# Patient Record
Sex: Female | Born: 1935 | Race: White | Hispanic: No | Smoking: Never smoker
Health system: Southern US, Community
[De-identification: ages and names within clinical notes are randomized; demographics above are authoritative.]

## PROBLEM LIST (undated history)

## (undated) DIAGNOSIS — L570 Actinic keratosis: Secondary | ICD-10-CM

## (undated) HISTORY — DX: Actinic keratosis: L57.0

---

## 2005-03-29 ENCOUNTER — Ambulatory Visit: Payer: Self-pay | Admitting: Family Medicine

## 2006-05-27 ENCOUNTER — Ambulatory Visit: Payer: Self-pay | Admitting: Family Medicine

## 2007-09-22 ENCOUNTER — Ambulatory Visit: Payer: Self-pay | Admitting: Family Medicine

## 2007-09-24 ENCOUNTER — Ambulatory Visit: Payer: Self-pay | Admitting: Family Medicine

## 2008-07-11 ENCOUNTER — Ambulatory Visit: Payer: Self-pay | Admitting: Family Medicine

## 2008-09-26 ENCOUNTER — Ambulatory Visit: Payer: Self-pay | Admitting: Family Medicine

## 2009-10-17 ENCOUNTER — Ambulatory Visit: Payer: Self-pay | Admitting: Family Medicine

## 2010-10-30 ENCOUNTER — Ambulatory Visit: Payer: Self-pay | Admitting: Family Medicine

## 2012-04-15 ENCOUNTER — Ambulatory Visit: Payer: Self-pay | Admitting: Family Medicine

## 2013-04-30 ENCOUNTER — Ambulatory Visit: Payer: Self-pay | Admitting: Family Medicine

## 2014-05-10 ENCOUNTER — Ambulatory Visit: Payer: Self-pay | Admitting: Family Medicine

## 2016-01-30 ENCOUNTER — Other Ambulatory Visit: Payer: Self-pay | Admitting: Family Medicine

## 2016-01-30 DIAGNOSIS — Z1231 Encounter for screening mammogram for malignant neoplasm of breast: Secondary | ICD-10-CM

## 2016-02-14 ENCOUNTER — Other Ambulatory Visit: Payer: Self-pay | Admitting: Family Medicine

## 2016-02-14 ENCOUNTER — Ambulatory Visit
Admission: RE | Admit: 2016-02-14 | Discharge: 2016-02-14 | Disposition: A | Discharge status: Home / Self Care | Payer: Medicare Other | Source: Ambulatory Visit | Attending: Family Medicine | Admitting: Family Medicine

## 2016-02-14 DIAGNOSIS — Z1231 Encounter for screening mammogram for malignant neoplasm of breast: Secondary | ICD-10-CM

## 2017-03-21 ENCOUNTER — Other Ambulatory Visit: Payer: Self-pay | Admitting: Family Medicine

## 2017-04-17 ENCOUNTER — Other Ambulatory Visit: Payer: Self-pay | Admitting: Family Medicine

## 2017-04-17 DIAGNOSIS — Z1231 Encounter for screening mammogram for malignant neoplasm of breast: Secondary | ICD-10-CM

## 2017-04-30 ENCOUNTER — Ambulatory Visit
Admission: RE | Admit: 2017-04-30 | Discharge: 2017-04-30 | Disposition: A | Discharge status: Home / Self Care | Payer: Medicare Other | Source: Ambulatory Visit | Attending: Family Medicine | Admitting: Family Medicine

## 2017-04-30 DIAGNOSIS — Z1231 Encounter for screening mammogram for malignant neoplasm of breast: Secondary | ICD-10-CM | POA: Insufficient documentation

## 2018-06-09 ENCOUNTER — Other Ambulatory Visit: Payer: Self-pay | Admitting: Family Medicine

## 2018-06-09 DIAGNOSIS — Z1231 Encounter for screening mammogram for malignant neoplasm of breast: Secondary | ICD-10-CM

## 2018-07-02 ENCOUNTER — Ambulatory Visit
Admission: RE | Admit: 2018-07-02 | Discharge: 2018-07-02 | Disposition: A | Discharge status: Home / Self Care | Payer: Medicare Other | Source: Ambulatory Visit | Attending: Family Medicine | Admitting: Family Medicine

## 2018-07-02 DIAGNOSIS — Z1231 Encounter for screening mammogram for malignant neoplasm of breast: Secondary | ICD-10-CM | POA: Insufficient documentation

## 2019-05-17 DIAGNOSIS — C4491 Basal cell carcinoma of skin, unspecified: Secondary | ICD-10-CM

## 2019-05-17 HISTORY — DX: Basal cell carcinoma of skin, unspecified: C44.91

## 2019-05-31 DIAGNOSIS — C439 Malignant melanoma of skin, unspecified: Secondary | ICD-10-CM

## 2019-05-31 HISTORY — DX: Malignant melanoma of skin, unspecified: C43.9

## 2019-08-09 ENCOUNTER — Other Ambulatory Visit: Payer: Self-pay | Admitting: Family Medicine

## 2019-08-09 DIAGNOSIS — Z1231 Encounter for screening mammogram for malignant neoplasm of breast: Secondary | ICD-10-CM

## 2019-09-07 ENCOUNTER — Ambulatory Visit
Admission: RE | Admit: 2019-09-07 | Discharge: 2019-09-07 | Disposition: A | Discharge status: Home / Self Care | Payer: Medicare Other | Source: Ambulatory Visit | Attending: Family Medicine | Admitting: Family Medicine

## 2019-09-07 DIAGNOSIS — Z1231 Encounter for screening mammogram for malignant neoplasm of breast: Secondary | ICD-10-CM | POA: Diagnosis present

## 2020-02-21 ENCOUNTER — Other Ambulatory Visit: Payer: Self-pay

## 2020-02-21 ENCOUNTER — Encounter: Payer: Self-pay | Admitting: Dermatology

## 2020-02-21 ENCOUNTER — Ambulatory Visit: Payer: Medicare Other | Admitting: Dermatology

## 2020-02-21 DIAGNOSIS — L821 Other seborrheic keratosis: Secondary | ICD-10-CM

## 2020-02-21 DIAGNOSIS — Z85820 Personal history of malignant melanoma of skin: Secondary | ICD-10-CM | POA: Diagnosis not present

## 2020-02-21 DIAGNOSIS — L720 Epidermal cyst: Secondary | ICD-10-CM

## 2020-02-21 DIAGNOSIS — L578 Other skin changes due to chronic exposure to nonionizing radiation: Secondary | ICD-10-CM

## 2020-02-21 DIAGNOSIS — L814 Other melanin hyperpigmentation: Secondary | ICD-10-CM

## 2020-02-21 DIAGNOSIS — D692 Other nonthrombocytopenic purpura: Secondary | ICD-10-CM

## 2020-02-21 DIAGNOSIS — Z1283 Encounter for screening for malignant neoplasm of skin: Secondary | ICD-10-CM | POA: Diagnosis not present

## 2020-02-21 DIAGNOSIS — D1800 Hemangioma unspecified site: Secondary | ICD-10-CM

## 2020-02-21 DIAGNOSIS — L565 Disseminated superficial actinic porokeratosis (DSAP): Secondary | ICD-10-CM

## 2020-02-21 DIAGNOSIS — Z85828 Personal history of other malignant neoplasm of skin: Secondary | ICD-10-CM

## 2020-02-21 DIAGNOSIS — L570 Actinic keratosis: Secondary | ICD-10-CM

## 2020-02-21 NOTE — Patient Instructions (Signed)
Cryotherapy Aftercare  . Wash gently with soap and water everyday.   . Apply Vaseline and Band-Aid daily until healed.  

## 2020-02-21 NOTE — Progress Notes (Signed)
Follow-Up Visit   Subjective  Angela Trevino is a 86 y.o. female who presents for the following: Annual Exam (Total body skin exam, hx Melanoma L lat knee 05/31/19, hx BCC R post neck).  Has scaly spots on arms and legs she would like checked. H/o bx-proven AK R elbow. 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  Excised 05/31/2019   The following portions of the chart were reviewed this encounter and updated as appropriate:      Review of Systems:  No other skin or systemic complaints except as noted in HPI or Assessment and Plan.  Objective  Well appearing patient in no apparent distress; mood and affect are within normal limits.  A full examination was performed including scalp, head, eyes, ears, nose, lips, neck, chest, axillae, abdomen, back, buttocks, bilateral upper extremities, bilateral lower extremities, hands, feet, fingers, toes, fingernails, and toenails. All findings within normal limits unless otherwise noted below.  Objective  Left lateral Knee: Well healed scar with no evidence of recurrence, no lymphadenopathy.   Objective  R posterior neck: Well healed scar with no evidence of recurrence.   Objective  bil arms, legs: Pink scaly macules  Objective  L forearm x 6, R forearm x 1, R upper arm x 1 (8): Pink scaly macules   Objective  forehead: Firm white papules   Assessment & Plan    Actinic Damage - diffuse scaly erythematous macules with underlying dyspigmentation - Recommend daily broad spectrum sunscreen SPF 30+ to sun-exposed areas, reapply every 2 hours as needed.  - Call for new or changing lesions.  Purpura - Violaceous macules and patches - Benign - Related to age, sun damage and/or use of blood thinners - Observe - Can use OTC arnica containing moisturizer such as Dermend Bruise Formula if desired - Call for worsening or other concerns  Seborrheic Keratoses - Stuck-on, waxy,  tan-brown papules and plaques  - Discussed benign etiology and prognosis. - Observe - Call for any changes - R cheek, trunk, arms, legs Hemangiomas - Red papules - Discussed benign nature - Observe - Call for any changes  Lentigines - Scattered tan macules - Discussed due to sun exposure - Benign, observe - Call for any changes  Skin cancer screening performed today.   History of melanoma Left lateral Knee  Clear. Observe for recurrence. Call clinic for new or changing lesions.  Recommend regular skin exams, daily broad-spectrum spf 30+ sunscreen use, and photoprotection.     History of basal cell carcinoma (BCC) R posterior neck  Clear, observe for changes   DSAP (disseminated superficial actinic porokeratosis) bil arms, legs  Observe, recommend sunscreen, photoprotection   AK (actinic keratosis) (8) L forearm x 6, R forearm x 1, R upper arm x 1  Destruction of lesion - L forearm x 6, R forearm x 1, R upper arm x 1  Destruction method: cryotherapy   Informed consent: discussed and consent obtained   Lesion destroyed using liquid nitrogen: Yes   Region frozen until ice ball extended beyond lesion: Yes   Outcome: patient tolerated procedure well with no complications   Post-procedure details: wound care instructions given    Epidermal cyst forehead  Benign, observe.    Return in about 6 months (around 08/23/2020) for TBSE, hx Melanoma, BCC, AKs.   I, Sonya Hupman, RMA, am acting as scribe for [name], MD . Documentation: I have reviewed the above documentation for accuracy and completeness, and I agree  with the above.  [name] MD

## 2020-08-22 ENCOUNTER — Ambulatory Visit: Payer: Medicare Other | Admitting: Dermatology

## 2020-08-22 ENCOUNTER — Other Ambulatory Visit: Payer: Self-pay

## 2020-08-22 DIAGNOSIS — Z1283 Encounter for screening for malignant neoplasm of skin: Secondary | ICD-10-CM | POA: Diagnosis not present

## 2020-08-22 DIAGNOSIS — L720 Epidermal cyst: Secondary | ICD-10-CM | POA: Diagnosis not present

## 2020-08-22 DIAGNOSIS — D692 Other nonthrombocytopenic purpura: Secondary | ICD-10-CM

## 2020-08-22 DIAGNOSIS — L565 Disseminated superficial actinic porokeratosis (DSAP): Secondary | ICD-10-CM

## 2020-08-22 DIAGNOSIS — L578 Other skin changes due to chronic exposure to nonionizing radiation: Secondary | ICD-10-CM

## 2020-08-22 DIAGNOSIS — Z85828 Personal history of other malignant neoplasm of skin: Secondary | ICD-10-CM | POA: Diagnosis not present

## 2020-08-22 DIAGNOSIS — L853 Xerosis cutis: Secondary | ICD-10-CM

## 2020-08-22 DIAGNOSIS — L570 Actinic keratosis: Secondary | ICD-10-CM | POA: Diagnosis not present

## 2020-08-22 DIAGNOSIS — L814 Other melanin hyperpigmentation: Secondary | ICD-10-CM

## 2020-08-22 DIAGNOSIS — D1800 Hemangioma unspecified site: Secondary | ICD-10-CM

## 2020-08-22 DIAGNOSIS — Z85820 Personal history of malignant melanoma of skin: Secondary | ICD-10-CM

## 2020-08-22 DIAGNOSIS — D2261 Melanocytic nevi of right upper limb, including shoulder: Secondary | ICD-10-CM

## 2020-08-22 DIAGNOSIS — L821 Other seborrheic keratosis: Secondary | ICD-10-CM

## 2020-08-22 MED ORDER — TRIAMCINOLONE ACETONIDE 0.1 % EX CREA: 1.0000 "application " | TOPICAL_CREAM | Freq: Two times a day (BID) | CUTANEOUS | 1 refills | Status: AC | PRN

## 2020-08-22 NOTE — Patient Instructions (Addendum)
Dry Skin Care  What causes dry skin?  Dry skin is common and results from inadequate moisture in the outer skin layers. Dry skin usually results from the excessive loss of moisture from the skin surface. This occurs due to two major factors: 1. Normally the skin's oil glands deposit a layer of oil on the skin's surface. This layer of oil prevents the loss of moisture from the skin. Exposure to soaps, cleaners, solvents, and disinfectants removes this oily film, allowing water to escape. 2. Water loss from the skin increases when the humidity is low. During winter months we spend a lot of time indoors where the air is heated. Heated air has very low humidity. This also contributes to dry skin.  A tendency for dry skin may accompany such disorders as eczema. Also, as people age, the number of functioning oil glands decreases, and the tendency toward dry skin can be a sensation of skin tightness when emerging from the shower.  How do I manage dry skin?  1. Humidify your environment. This can be accomplished by using a humidifier in your bedroom at night during winter months. 2. Bathing can actually put moisture back into your skin if done right. Take the following steps while bathing to sooth dry skin:  Avoid hot water, which only dries the skin and makes itching worse. Use warm water.  Avoid washcloths or extensive rubbing or scrubbing.  Use mild soaps like unscented Dove, Oil of Olay, Cetaphil, Basis, or CeraVe.  If you take baths rather than showers, rinse off soap residue with clean water before getting out of tub.  Once out of the shower/tub, pat dry gently with a soft towel. Leave your skin damp.  While still damp, apply any medicated ointment/cream you were prescribed to the affected areas. After you apply your medicated ointment/cream, then apply your moisturizer to your whole body.This is the most important step in dry skin care. If this is omitted, your skin will continue to be  dry.  The choice of moisturizer is also very important. In general, lotion will not provider enough moisture to severely dry skin because it is water based. You should use an ointment or cream. Moisturizers should also be unscented. Good choices include Vaseline (plain petrolatum), Aquaphor, Cetaphil, CeraVe, Vanicream, DML Forte, Aveeno moisture, or Eucerin Cream.  Bath oils can be helpful, but do not replace the application of moisturizer after the bath. In addition, they make the tub slippery causing an increased risk for falls. Therefore, we do not recommend their use.    Melanoma ABCDEs  Melanoma is the most dangerous type of skin cancer, and is the leading cause of death from skin disease.  You are more likely to develop melanoma if you:  Have light-colored skin, light-colored eyes, or red or blond hair  Spend a lot of time in the sun  Tan regularly, either outdoors or in a tanning bed  Have had blistering sunburns, especially during childhood  Have a close family member who has had a melanoma  Have atypical moles or large birthmarks  Early detection of melanoma is key since treatment is typically straightforward and cure rates are extremely high if we catch it early.   The first sign of melanoma is often a change in a mole or a new dark spot.  The ABCDE system is a way of remembering the signs of melanoma.  A for asymmetry:  The two halves do not match. B for border:  The edges of the growth  are irregular. C for color:  A mixture of colors are present instead of an even brown color. D for diameter:  Melanomas are usually (but not always) greater than 12mm - the size of a pencil eraser. E for evolution:  The spot keeps changing in size, shape, and color.  Please check your skin once per month between visits. You can use a small mirror in front and a large mirror behind you to keep an eye on the back side or your body.   If you see any new or changing lesions before your next  follow-up, please call to schedule a visit.  Please continue daily skin protection including broad spectrum sunscreen SPF 30+ to sun-exposed areas, reapplying every 2 hours as needed when you're outdoors.   Cryotherapy Aftercare  . Wash gently with soap and water everyday.   Marland Kitchen Apply Vaseline and Band-Aid daily until healed.

## 2020-08-22 NOTE — Progress Notes (Signed)
Follow-Up Visit   Subjective  Angela Trevino is a 87 y.o. female who presents for the following: 6 month tbse (Patient here today for 6 month tbse. She has history of bcc on right posterior neck. Patient states she has been very itchy on back and shoulders. States it has been like this for a month. Patient denies using any new soaps, creams, medications or new laundry detergent. Patient also states she has new spot she noticed a month ago a spot on right thigh. ).  It is sore.    The following portions of the chart were reviewed this encounter and updated as appropriate:        Objective  Well appearing patient in no apparent distress; mood and affect are within normal limits.  A full examination was performed including scalp, head, eyes, ears, nose, lips, neck, chest, axillae, abdomen, back, buttocks, bilateral upper extremities, bilateral lower extremities, hands, feet, fingers, toes, fingernails, and toenails. All findings within normal limits unless otherwise noted below.  Objective  bilateral legs, bilateral arms: Pink brown scaly macules some with keratotic rim  Objective  Right anterior thigh x 1, left upper arm x 4, right upper arm x 2, right nasal tip x 1 (8): Erythematous thin papules/macules with gritty scale.   Objective  bilateral shoulder, back, bilateral legs: Dry flaky skin  Assessment & Plan  Epidermoid cyst of skin forehead  Benign-appearing. Exam most consistent with an epidermal inclusion cyst. Discussed that a cyst is a benign growth that can grow over time and sometimes get irritated or inflamed. Recommend observation if it is not bothersome. Discussed option of surgical excision to remove it if it is growing, symptomatic, or other changes noted. Please call for new or changing lesions so they can be evaluated.     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  Observation.   Call clinic for new or changing lesions.  Recommend daily use of broad spectrum spf 30+ sunscreen to sun-exposed areas. Photoprotection   Actinic keratosis (8) Right anterior thigh x 1, left upper arm x 4, right upper arm x 2, right nasal tip x 1  Hypertrophic AK R ant thigh Prior to procedure, discussed risks of blister formation, small wound, skin dyspigmentation, or rare scar following cryotherapy.    Destruction of lesion - Right anterior thigh x 1, left upper arm x 4, right upper arm x 2, right nasal tip x 1  Destruction method: cryotherapy   Informed consent: discussed and consent obtained   Lesion destroyed using liquid nitrogen: Yes   Region frozen until ice ball extended beyond lesion: Yes   Outcome: patient tolerated procedure well with no complications   Post-procedure details: wound care instructions given    Xerosis cutis bilateral shoulder, back, bilateral legs  With pruritus, due to chronically dry skin  Tmc 0.1 % cream use once or twice daily as needed for itchy areas of shoulders and back   Recommend mild soap and moisturizing cream 1-2 times daily.      triamcinolone (KENALOG) 0.1 % - bilateral shoulder, back, bilateral legs  Lentigines - Scattered tan macules - Discussed due to sun exposure - Benign, observe - Call for any changes  Seborrheic Keratoses Back, right side cheek, bilateral legs - Stuck-on, waxy, tan-brown papules and plaques  - Discussed benign etiology and prognosis. - Observe - Call for any changes  Melanocytic Nevi Right arms  - Tan-brown and/or pink-flesh-colored symmetric macules and papules -  Benign appearing on exam today - Observation - Call clinic for new or changing moles - Recommend daily use of broad spectrum spf 30+ sunscreen to sun-exposed areas.   Hemangiomas back - Red papules - Discussed benign nature - Observe - Call for any changes  Actinic Damage - Chronic, secondary to cumulative UV/sun exposure -  diffuse scaly erythematous macules with underlying dyspigmentation - Recommend daily broad spectrum sunscreen SPF 30+ to sun-exposed areas, reapply every 2 hours as needed.  - Call for new or changing lesions.  Purpura - Chronic; persistent and recurrent.  Treatable, but not curable. Bilateral arms  - Violaceous macules and patches - Benign - Related to trauma, age, sun damage and/or use of blood thinners, chronic use of topical and/or oral steroids - Observe - Can use OTC arnica containing moisturizer such as Dermend Bruise Formula if desired - Call for worsening or other concerns   History of Basal Cell Carcinoma of the Skin Right posterior neck - No evidence of recurrence today - Recommend regular full body skin exams - Recommend daily broad spectrum sunscreen SPF 30+ to sun-exposed areas, reapply every 2 hours as needed.  - Call if any new or changing lesions are noted between office visits  History of Melanoma Left lateral knee - No evidence of recurrence today - No lymphadenopathy - Recommend regular full body skin exams - Recommend daily broad spectrum sunscreen SPF 30+ to sun-exposed areas, reapply every 2 hours as needed.  - Call if any new or changing lesions are noted between office visits  Actinic Damage - chronic, secondary to cumulative UV radiation exposure/sun exposure over time - diffuse scaly erythematous macules with underlying dyspigmentation - Recommend daily broad spectrum sunscreen SPF 30+ to sun-exposed areas, reapply every 2 hours as needed.  - Call for new or changing lesions.   Skin cancer screening performed today.  Return in about 6 months (around 02/19/2021) for tbse, ak .  I, Ruthell Rummage, CMA, am acting as scribe for [name], MD.  Documentation: I have reviewed the above documentation for accuracy and completeness, and I agree with the above.  [name] MD

## 2021-01-30 ENCOUNTER — Ambulatory Visit: Payer: Medicare Other | Admitting: Dermatology

## 2021-02-20 ENCOUNTER — Other Ambulatory Visit: Payer: Self-pay | Admitting: Family Medicine

## 2021-02-20 DIAGNOSIS — Z1231 Encounter for screening mammogram for malignant neoplasm of breast: Secondary | ICD-10-CM

## 2021-03-09 ENCOUNTER — Other Ambulatory Visit: Payer: Self-pay

## 2021-03-09 ENCOUNTER — Ambulatory Visit
Admission: RE | Admit: 2021-03-09 | Discharge: 2021-03-09 | Disposition: A | Discharge status: Home / Self Care | Payer: Medicare Other | Source: Ambulatory Visit | Attending: Family Medicine | Admitting: Family Medicine

## 2021-03-09 DIAGNOSIS — Z1231 Encounter for screening mammogram for malignant neoplasm of breast: Secondary | ICD-10-CM | POA: Insufficient documentation

## 2021-05-07 ENCOUNTER — Other Ambulatory Visit: Payer: Self-pay

## 2021-05-07 ENCOUNTER — Ambulatory Visit: Payer: Medicare Other | Admitting: Dermatology

## 2021-05-07 DIAGNOSIS — Z85828 Personal history of other malignant neoplasm of skin: Secondary | ICD-10-CM | POA: Diagnosis not present

## 2021-05-07 DIAGNOSIS — Z85820 Personal history of malignant melanoma of skin: Secondary | ICD-10-CM

## 2021-05-07 DIAGNOSIS — Z1283 Encounter for screening for malignant neoplasm of skin: Secondary | ICD-10-CM

## 2021-05-07 DIAGNOSIS — D229 Melanocytic nevi, unspecified: Secondary | ICD-10-CM

## 2021-05-07 DIAGNOSIS — L570 Actinic keratosis: Secondary | ICD-10-CM | POA: Diagnosis not present

## 2021-05-07 DIAGNOSIS — D1800 Hemangioma unspecified site: Secondary | ICD-10-CM

## 2021-05-07 DIAGNOSIS — D692 Other nonthrombocytopenic purpura: Secondary | ICD-10-CM

## 2021-05-07 DIAGNOSIS — L821 Other seborrheic keratosis: Secondary | ICD-10-CM

## 2021-05-07 DIAGNOSIS — L814 Other melanin hyperpigmentation: Secondary | ICD-10-CM

## 2021-05-07 DIAGNOSIS — L565 Disseminated superficial actinic porokeratosis (DSAP): Secondary | ICD-10-CM

## 2021-05-07 DIAGNOSIS — L578 Other skin changes due to chronic exposure to nonionizing radiation: Secondary | ICD-10-CM

## 2021-05-07 MED ORDER — AMMONIUM LACTATE 12 % EX CREA: 1.0000 "application " | TOPICAL_CREAM | Freq: Every day | CUTANEOUS | 3 refills | Status: DC

## 2021-05-07 NOTE — Progress Notes (Signed)
Follow-Up Visit   Subjective  Angela Trevino is a 87 y.o. female who presents for the following: Total body skin exam (Hx of Melanoma L lat knee, hx of BCC R post neck, hx of AKs).   The following portions of the chart were reviewed this encounter and updated as appropriate:       Review of Systems:  No other skin or systemic complaints except as noted in HPI or Assessment and Plan.  Objective  Well appearing patient in no apparent distress; mood and affect are within normal limits.  A full examination was performed including scalp, head, eyes, ears, nose, lips, neck, chest, axillae, abdomen, back, buttocks, bilateral upper extremities, bilateral lower extremities, hands, feet, fingers, toes, fingernails, and toenails. All findings within normal limits unless otherwise noted below.  L lat knee Well healed scar with no evidence of recurrence, no lymphadenopathy.   R post neck Well healed scar with no evidence of recurrence.  bil legs, bil arms Multiple pink brown scaly macules some with keratotic rim  L nasal dorsum x 1 Pink scaly macule    Assessment & Plan  Skin cancer screening performed today.  Lentigines - Scattered tan macules - Due to sun exposure - Benign-appearing, observe - Recommend daily broad spectrum sunscreen SPF 30+ to sun-exposed areas, reapply every 2 hours as needed. - Call for any changes - back  Seborrheic Keratoses - Stuck-on, waxy, tan-brown papules and/or plaques  - Benign-appearing - Discussed benign etiology and prognosis. - Observe - Call for any changes - back, abdomen, R cheek  Melanocytic Nevi - Tan-brown and/or pink-flesh-colored symmetric macules and papules - Benign appearing on exam today - Observation - Call clinic for new or changing moles - Recommend daily use of broad spectrum spf 30+ sunscreen to sun-exposed areas.  - arms  Hemangiomas - Red papules - Discussed benign nature - Observe - Call for any changes -  back, abdomen  Actinic Damage - Chronic condition, secondary to cumulative UV/sun exposure - diffuse scaly erythematous macules with underlying dyspigmentation - Recommend daily broad spectrum sunscreen SPF 30+ to sun-exposed areas, reapply every 2 hours as needed.  - Staying in the shade or wearing long sleeves, sun glasses (UVA+UVB protection) and wide brim hats (4-inch brim around the entire circumference of the hat) are also recommended for sun protection.  - Call for new or changing lesions. - chest  Purpura - Chronic; persistent and recurrent.  Treatable, but not curable. - Violaceous macules and patches - Benign - Related to trauma, age, sun damage and/or use of blood thinners, chronic use of topical and/or oral steroids - Observe - Can use OTC arnica containing moisturizer such as Dermend Bruise Formula if desired - Call for worsening or other concerns  History of melanoma L lat knee  BRESLOW'S DEPTH/MAXIMUM TUMOR THICKNESS: 0.7 MM CLARK/ANATOMIC LEVEL: III.   Excised 05/31/2019  Clear. Observe for recurrence. Call clinic for new or changing lesions.  Recommend regular skin exams, daily broad-spectrum spf 30+ sunscreen use, and photoprotection.    History of basal cell carcinoma (BCC) R post neck  Clear. Observe for recurrence. Call clinic for new or changing lesions.  Recommend regular skin exams, daily broad-spectrum spf 30+ sunscreen use, and photoprotection.    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  Difficult to treat.  Recommend photoprotection and regular use of spf 30 or higher sunscreen to prevent worsening of condition  and precancerous changes.  Discussed Cholesterol 2% Lovastatin 2% Cream but pt has statin allergy  Recommend Amlactin 12% cream qd to arms and legs sent to [facility] .  ammonium lactate (AMLACTIN) 12 % cream - bil legs, bil  arms Apply 1 application topically daily. Qd to legs and arms  AK (actinic keratosis) L nasal dorsum x 1  Destruction of lesion - L nasal dorsum x 1  Destruction method: cryotherapy   Informed consent: discussed and consent obtained   Lesion destroyed using liquid nitrogen: Yes   Region frozen until ice ball extended beyond lesion: Yes   Outcome: patient tolerated procedure well with no complications   Post-procedure details: wound care instructions given   Additional details:  Prior to procedure, discussed risks of blister formation, small wound, skin dyspigmentation, or rare scar following cryotherapy. Recommend Vaseline ointment to treated areas while healing.   Return in about 6 months (around 11/04/2021) for TBSE, Hx of Melanoma, Hx of BCC, Hx of AKs, recheck Dsap on legs.  I, Othelia Pulling, RMA, am acting as scribe for [name], MD .  Documentation: I have reviewed the above documentation for accuracy and completeness, and I agree with the above.  [name] MD

## 2021-05-07 NOTE — Patient Instructions (Signed)

## 2021-09-26 ENCOUNTER — Encounter: Payer: Self-pay | Admitting: Dermatology

## 2021-11-05 ENCOUNTER — Ambulatory Visit: Payer: Medicare Other | Admitting: Dermatology

## 2021-11-05 DIAGNOSIS — D2261 Melanocytic nevi of right upper limb, including shoulder: Secondary | ICD-10-CM

## 2021-11-05 DIAGNOSIS — Z85820 Personal history of malignant melanoma of skin: Secondary | ICD-10-CM | POA: Diagnosis not present

## 2021-11-05 DIAGNOSIS — L565 Disseminated superficial actinic porokeratosis (DSAP): Secondary | ICD-10-CM

## 2021-11-05 DIAGNOSIS — L578 Other skin changes due to chronic exposure to nonionizing radiation: Secondary | ICD-10-CM

## 2021-11-05 DIAGNOSIS — Z85828 Personal history of other malignant neoplasm of skin: Secondary | ICD-10-CM | POA: Diagnosis not present

## 2021-11-05 DIAGNOSIS — Z1283 Encounter for screening for malignant neoplasm of skin: Secondary | ICD-10-CM | POA: Diagnosis not present

## 2021-11-05 DIAGNOSIS — D229 Melanocytic nevi, unspecified: Secondary | ICD-10-CM

## 2021-11-05 DIAGNOSIS — L570 Actinic keratosis: Secondary | ICD-10-CM | POA: Diagnosis not present

## 2021-11-05 DIAGNOSIS — D692 Other nonthrombocytopenic purpura: Secondary | ICD-10-CM

## 2021-11-05 DIAGNOSIS — D1800 Hemangioma unspecified site: Secondary | ICD-10-CM

## 2021-11-05 DIAGNOSIS — L814 Other melanin hyperpigmentation: Secondary | ICD-10-CM

## 2021-11-05 DIAGNOSIS — L821 Other seborrheic keratosis: Secondary | ICD-10-CM

## 2021-11-05 NOTE — Progress Notes (Signed)
? ?Follow-Up Visit ?  ?Subjective  ?Angela Trevino is a 88 y.o. female who presents for the following: Follow-up. ? ?The patient presents for 6 month follow-up and Total-Body Skin Exam (TBSE) for skin cancer screening and mole check.  The patient has spots, moles and lesions to be evaluated, some may be new or changing. She has a history of melanoma of the left lateral knee and history of BCC of the right posterior neck.   ? ?The following portions of the chart were reviewed this encounter and updated as appropriate:  ?  ?  ? ?Review of Systems:  No other skin or systemic complaints except as noted in HPI or Assessment and Plan. ? ?Objective  ?Well appearing patient in no apparent distress; mood and affect are within normal limits. ? ?A full examination was performed including scalp, head, eyes, ears, nose, lips, neck, chest, axillae, abdomen, back, buttocks, bilateral upper extremities, bilateral lower extremities, hands, feet, fingers, toes, fingernails, and toenails. All findings within normal limits unless otherwise noted below. ? ?left lateral knee ?Well healed scar with no evidence of recurrence  ? ?arms, legs ?Multiple pink brown scaly macules some with keratotic rim ? ?R lower leg x 3, R hand dorsum x 1 (4) ?Pink scaly macules ? ?Right Upper Arm ?7.0 mm flesh, smooth papule - present for years per pt ? ? ? ?Assessment & Plan  ?Skin cancer screening performed today. ? ?Actinic Damage ?- chronic, secondary to cumulative UV radiation exposure/sun exposure over time ?- diffuse scaly erythematous macules with underlying dyspigmentation ?- Recommend daily broad spectrum sunscreen SPF 30+ to sun-exposed areas, reapply every 2 hours as needed.  ?- Recommend staying in the shade or wearing long sleeves, sun glasses (UVA+UVB protection) and wide brim hats (4-inch brim around the entire circumference of the hat). ?- Call for new or changing lesions. ? ?Lentigines ?- Scattered tan macules ?- Due to sun exposure ?-  Benign-appering, observe ?- Recommend daily broad spectrum sunscreen SPF 30+ to sun-exposed areas, reapply every 2 hours as needed. ?- Call for any changes ? ?Seborrheic Keratoses ?- Stuck-on, waxy, tan-brown papules and/or plaques  ?- Benign-appearing ?- Discussed benign etiology and prognosis. ?- Observe ?- Call for any changes ? ?History of Basal Cell Carcinoma of the Skin ?- No evidence of recurrence today of the right posterior neck ?- Recommend regular full body skin exams ?- Recommend daily broad spectrum sunscreen SPF 30+ to sun-exposed areas, reapply every 2 hours as needed.  ?- Call if any new or changing lesions are noted between office visits ? ?Purpura - Chronic; persistent and recurrent.  Treatable, but not curable. ?- Violaceous macules and patches ?- Benign ?- Related to trauma, age, sun damage and/or use of blood thinners, chronic use of topical and/or oral steroids ?- Observe ?- Can use OTC arnica containing moisturizer such as Dermend Bruise Formula if desired ?- Call for worsening or other concerns ? ?Hemangiomas ?- Red papules ?- Discussed benign nature ?- Observe ?- Call for any changes ? ?History of melanoma ?left lateral knee ? 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 Excised 07/05/2019, margins free. ? ?Clear. Observe for recurrence. Call clinic for new or changing lesions.  Recommend regular skin exams, daily broad-spectrum spf 30+ sunscreen use, and photoprotection.   ? ?DSAP (disseminated superficial actinic porokeratosis) ?arms, legs ? ?Chronic and persistent condition with duration or expected duration over one year. Not currently at goal.  ? ?DSAP is a chronic inherited condition of sun-exposed skin, most commonly  affecting the arms and legs.  It is difficult to treat.  Recommend photoprotection and regular use of spf 30 or higher sunscreen to prevent worsening of condition and precancerous changes. ? ?Pt has used cholesterol/lovastatin cream and ammonium lactate 12% cream in  the past, but didn't help. May d/c.  Continue daily sunscreen when outdoors ? ?Related Medications ?ammonium lactate (AMLACTIN) 12 % cream ?Apply 1 application topically daily. Qd to legs and arms ? ?AK (actinic keratosis) (4) ?R lower leg x 3, R hand dorsum x 1 ? ?vs ISK vs DSAP ? ?Actinic keratoses are precancerous spots that appear secondary to cumulative UV radiation exposure/sun exposure over time. They are chronic with expected duration over 1 year. A portion of actinic keratoses will progress to squamous cell carcinoma of the skin. It is not possible to reliably predict which spots will progress to skin cancer and so treatment is recommended to prevent development of skin cancer. ? ?Recommend daily broad spectrum sunscreen SPF 30+ to sun-exposed areas, reapply every 2 hours as needed.  ?Recommend staying in the shade or wearing long sleeves, sun glasses (UVA+UVB protection) and wide brim hats (4-inch brim around the entire circumference of the hat). ?Call for new or changing lesions. ? ?Destruction of lesion - R lower leg x 3, R hand dorsum x 1 ? ?Destruction method: cryotherapy   ?Informed consent: discussed and consent obtained   ?Lesion destroyed using liquid nitrogen: Yes   ?Region frozen until ice ball extended beyond lesion: Yes   ?Outcome: patient tolerated procedure well with no complications   ?Post-procedure details: wound care instructions given   ?Additional details:  Prior to procedure, discussed risks of blister formation, small wound, skin dyspigmentation, or rare scar following cryotherapy. Recommend Vaseline ointment to treated areas while healing. ? ? ?Nevus ?Right Upper Arm ? ?Benign-appearing.  Observation.  Call clinic for new or changing moles.  Recommend daily use of broad spectrum spf 30+ sunscreen to sun-exposed areas.  ? ? ?Return in about 6 months (around 05/08/2022) for TBSE, Hx melanoma. ? ?I, Angela Trevino, CMA, am acting as scribe for Angela Patty, MD . ? ?Documentation: I have  reviewed the above documentation for accuracy and completeness, and I agree with the above. ? ?Angela Patty MD  ? ?

## 2021-11-05 NOTE — Patient Instructions (Addendum)
Cryotherapy Aftercare ? ?Wash gently with soap and water everyday.   ?Apply Vaseline and Band-Aid daily until healed.  ? ?Seborrheic Keratosis ? ?What causes seborrheic keratoses? ?Seborrheic keratoses are harmless, common skin growths that first appear during adult life.  As time goes by, more growths appear.  Some people may develop a large number of them.  Seborrheic keratoses appear on both covered and uncovered body parts.  They are not caused by sunlight.  The tendency to develop seborrheic keratoses can be inherited.  They vary in color from skin-colored to gray, brown, or even black.  They can be either smooth or have a rough, warty surface.   ?Seborrheic keratoses are superficial and look as if they were stuck on the skin.  Under the microscope this type of keratosis looks like layers upon layers of skin.  That is why at times the top layer may seem to fall off, but the rest of the growth remains and re-grows.   ? ?Treatment ?Seborrheic keratoses do not need to be treated, but can easily be removed in the office.  Seborrheic keratoses often cause symptoms when they rub on clothing or jewelry.  Lesions can be in the way of shaving.  If they become inflamed, they can cause itching, soreness, or burning.  Removal of a seborrheic keratosis can be accomplished by freezing, burning, or surgery. ?If any spot bleeds, scabs, or grows rapidly, please return to have it checked, as these can be an indication of a skin cancer. ? ?If You Need Anything After Your Visit ? ?If you have any questions or concerns for your doctor, please call our main line at [phone] and press option 4 to reach your doctor's medical assistant. If no one answers, please leave a voicemail as directed and we will return your call as soon as possible. Messages left after 4 pm will be answered the following business day.  ? ?You may also send [place] a message via MyChart. We typically respond to MyChart messages within 1-2 business days. ? ?For  prescription refills, please ask your pharmacy to contact our office. Our fax number is [phone]. ? ?If you have an urgent issue when the clinic is closed that cannot wait until the next business day, you can page your doctor at the number below.   ? ?Please note that while we do our best to be available for urgent issues outside of office hours, we are not available 24/7.  ? ?If you have an urgent issue and are unable to reach [place], you may choose to seek medical care at your doctor's office, retail clinic, urgent care center, or emergency room. ? ?If you have a medical emergency, please immediately call 911 or go to the emergency department. ? ?Pager Numbers ? ?- Dr. Nehemiah Massed: [phone] ? ?- Dr. Laurence Ferrari: [phone] ? ?- Dr. Nicole Kindred: [phone] ? ?In the event of inclement weather, please call our main line at [phone] for an update on the status of any delays or closures. ? ?Dermatology Medication Tips: ?Please keep the boxes that topical medications come in in order to help keep track of the instructions about where and how to use these. Pharmacies typically print the medication instructions only on the boxes and not directly on the medication tubes.  ? ?If your medication is too expensive, please contact our office at [phone] option 4 or send [place] a message through [place].  ? ?We are unable to tell what your co-pay for medications will be in advance as this  is different depending on your insurance coverage. However, we may be able to find a substitute medication at lower cost or fill out paperwork to get insurance to cover a needed medication.  ? ?If a prior authorization is required to get your medication covered by your insurance company, please allow [place] 1-2 business days to complete this process. ? ?Drug prices often vary depending on where the prescription is filled and some pharmacies may offer cheaper prices. ? ?The website www.goodrx.com contains coupons for medications through different  pharmacies. The prices here do not account for what the cost may be with help from insurance (it may be cheaper with your insurance), but the website can give you the price if you did not use any insurance.  ?- You can print the associated coupon and take it with your prescription to the pharmacy.  ?- You may also stop by our office during regular business hours and pick up a GoodRx coupon card.  ?- If you need your prescription sent electronically to a different pharmacy, notify our office through [facility] or by phone at [phone] option 4. ? ? ? ? ?Si Usted Necesita Algo Despu?s de Su Visita ? ?Tambi?n puede enviarnos un mensaje a trav?s de MyChart. Por lo general respondemos a los mensajes de MyChart en el transcurso de 1 a 2 d?as h?biles. ? ?Para renovar recetas, por favor pida a su farmacia que se ponga en contacto con nuestra oficina. Nuestro n?mero de fax es el [phone]. ? ?Si tiene un asunto urgente cuando la cl?nica est? cerrada y que no puede esperar hasta el siguiente d?a h?bil, puede llamar/localizar a su doctor(a) al n?mero que aparece a continuaci?n.  ? ?Por favor, tenga en cuenta que aunque hacemos todo lo posible para estar disponibles para asuntos urgentes fuera del horario de oficina, no estamos disponibles las 24 horas del d?a, los 7 d?as de la semana.  ? ?Si tiene un problema urgente y no puede comunicarse con nosotros, puede optar por buscar atenci?n m?dica  en el consultorio de su doctor(a), en una cl?nica privada, en un centro de atenci?n urgente o en una sala de emergencias. ? ?Si tiene Engineer, maintenance (IT) m?dica, por favor llame inmediatamente al 911 o vaya a la sala de emergencias. ? ?N?meros de b?per ? ?- Dr. Nehemiah Massed: [phone] ? ?- Dra. Moye: [phone] ? ?- Dra. Nicole Kindred: [phone] ? ?En caso de inclemencias del tiempo, por favor llame a nuestra l?nea principal al [phone] para una actualizaci?n sobre el estado de cualquier retraso o cierre. ? ?Consejos para la  medicaci?n en dermatolog?a: ?Por favor, guarde las cajas en las que vienen los medicamentos de uso t?pico para ayudarle a seguir las instrucciones sobre d?nde y c?mo usarlos. Las farmacias generalmente imprimen las instrucciones del medicamento s?lo en las cajas y no directamente en los tubos del [place].  ? ?Si su medicamento es muy caro, por favor, p?ngase en contacto con Zigmund Daniel llamando al [phone] y presione la opci?n 4 o env?enos un mensaje a trav?s de MyChart.  ? ?No podemos decirle cu?l ser? su copago por los medicamentos por adelantado ya que esto es diferente dependiendo de la cobertura de su seguro. Sin embargo, es posible que podamos encontrar un medicamento sustituto a Electrical engineer un formulario para que el seguro cubra el medicamento que se considera necesario.  ? ?Si se requiere Ardelia Mems autorizaci?n previa para que su compa??a de seguros [place] su medicamento, por favor perm?tanos de 1 a 2 d?as h?biles para  completar [employer]. ? ?Los precios de los medicamentos var?an con frecuencia dependiendo del Environmental consultant de d?nde se surte la receta y alguna farmacias pueden ofrecer precios m?s baratos. ? ?El sitio web www.goodrx.com tiene cupones para medicamentos de Airline pilot. Los precios aqu? no tienen en cuenta lo que podr?a costar con la ayuda del seguro (puede ser m?s barato con su seguro), pero el sitio web puede darle el precio si no utiliz? ning?n seguro.  ?- Puede imprimir el cup?n correspondiente y llevarlo con su receta a la farmacia.  ?- Tambi?n puede pasar por nuestra oficina durante el horario de atenci?n regular y recoger una tarjeta de cupones de GoodRx.  ?- Si necesita que su receta se env?e electr?nicamente a Chiropodist, informe a nuestra oficina a trav?s de MyChart de [place] o por tel?fono llamando al [phone] y presione la opci?n 4. ? ?

## 2021-12-12 DIAGNOSIS — I10 Essential (primary) hypertension: Secondary | ICD-10-CM

## 2022-02-20 ENCOUNTER — Other Ambulatory Visit: Payer: Self-pay | Admitting: Family Medicine

## 2022-02-20 DIAGNOSIS — Z1231 Encounter for screening mammogram for malignant neoplasm of breast: Secondary | ICD-10-CM

## 2022-03-04 ENCOUNTER — Ambulatory Visit: Payer: Medicare Other | Admitting: Dermatology

## 2022-03-04 DIAGNOSIS — L820 Inflamed seborrheic keratosis: Secondary | ICD-10-CM | POA: Diagnosis not present

## 2022-03-04 DIAGNOSIS — Z85820 Personal history of malignant melanoma of skin: Secondary | ICD-10-CM | POA: Diagnosis not present

## 2022-03-04 DIAGNOSIS — L578 Other skin changes due to chronic exposure to nonionizing radiation: Secondary | ICD-10-CM | POA: Diagnosis not present

## 2022-03-04 DIAGNOSIS — L821 Other seborrheic keratosis: Secondary | ICD-10-CM

## 2022-03-04 NOTE — Progress Notes (Signed)
   Follow-Up Visit   Subjective  Angela Trevino is a 88 y.o. female who presents for the following: Skin Problem (Patient here today for a new spot at left lower back. Patient noticed about 2 weeks ago, did itch and got a scab on it. ).   The following portions of the chart were reviewed this encounter and updated as appropriate:       Review of Systems:  No other skin or systemic complaints except as noted in HPI or Assessment and Plan.  Objective  Well appearing patient in no apparent distress; mood and affect are within normal limits.  A focused examination was performed including back. Relevant physical exam findings are noted in the Assessment and Plan.  Left Lower Back Erythematous stuck-on, waxy papule    Assessment & Plan  Inflamed seborrheic keratosis Left Lower Back  Symptomatic, irritating, patient would like treated.   Destruction of lesion - Left Lower Back  Destruction method: cryotherapy   Informed consent: discussed and consent obtained   Lesion destroyed using liquid nitrogen: Yes   Region frozen until ice ball extended beyond lesion: Yes   Outcome: patient tolerated procedure well with no complications   Post-procedure details: wound care instructions given   Additional details:  Prior to procedure, discussed risks of blister formation, small wound, skin dyspigmentation, or rare scar following cryotherapy. Recommend Vaseline ointment to treated areas while healing.    Seborrheic Keratoses - Stuck-on, waxy, tan-brown papules and/or plaques  - Benign-appearing - Discussed benign etiology and prognosis. - Observe - Call for any changes  History of Melanoma - No evidence of recurrence today at left lateral knee - Recommend regular full body skin exams - Recommend daily broad spectrum sunscreen SPF 30+ to sun-exposed areas, reapply every 2 hours as needed.  - Call if any new or changing lesions are noted between office visits  Actinic Damage -  chronic, secondary to cumulative UV radiation exposure/sun exposure over time - diffuse scaly erythematous macules with underlying dyspigmentation - Recommend daily broad spectrum sunscreen SPF 30+ to sun-exposed areas, reapply every 2 hours as needed.  - Recommend staying in the shade or wearing long sleeves, sun glasses (UVA+UVB protection) and wide brim hats (4-inch brim around the entire circumference of the hat). - Call for new or changing lesions.  Return for as scheduled, TBSE, ISK follow up.  Graciella Belton, RMA, am acting as scribe for [name], MD .  Documentation: I have reviewed the above documentation for accuracy and completeness, and I agree with the above.  [name] MD

## 2022-03-04 NOTE — Patient Instructions (Addendum)

## 2022-03-14 ENCOUNTER — Ambulatory Visit
Admission: RE | Admit: 2022-03-14 | Discharge: 2022-03-14 | Disposition: A | Discharge status: Home / Self Care | Payer: Medicare Other | Source: Ambulatory Visit | Attending: Family Medicine | Admitting: Family Medicine

## 2022-03-14 DIAGNOSIS — Y92008 Other place in unspecified non-institutional (private) residence as the place of occurrence of the external cause: Secondary | ICD-10-CM | POA: Diagnosis not present

## 2022-03-14 DIAGNOSIS — E876 Hypokalemia: Secondary | ICD-10-CM | POA: Diagnosis present

## 2022-03-14 DIAGNOSIS — W010XXA Fall on same level from slipping, tripping and stumbling without subsequent striking against object, initial encounter: Secondary | ICD-10-CM | POA: Diagnosis present

## 2022-03-14 DIAGNOSIS — I10 Essential (primary) hypertension: Secondary | ICD-10-CM | POA: Diagnosis not present

## 2022-03-14 DIAGNOSIS — S72141A Displaced intertrochanteric fracture of right femur, initial encounter for closed fracture: Principal | ICD-10-CM | POA: Diagnosis present

## 2022-03-14 DIAGNOSIS — Z1231 Encounter for screening mammogram for malignant neoplasm of breast: Secondary | ICD-10-CM | POA: Insufficient documentation

## 2022-03-14 DIAGNOSIS — Z85828 Personal history of other malignant neoplasm of skin: Secondary | ICD-10-CM

## 2022-03-14 DIAGNOSIS — I493 Ventricular premature depolarization: Secondary | ICD-10-CM | POA: Diagnosis not present

## 2022-03-14 DIAGNOSIS — K5903 Drug induced constipation: Secondary | ICD-10-CM | POA: Diagnosis not present

## 2022-03-14 DIAGNOSIS — D696 Thrombocytopenia, unspecified: Secondary | ICD-10-CM | POA: Diagnosis present

## 2022-03-14 DIAGNOSIS — Z20822 Contact with and (suspected) exposure to covid-19: Secondary | ICD-10-CM | POA: Diagnosis not present

## 2022-03-14 DIAGNOSIS — D62 Acute posthemorrhagic anemia: Secondary | ICD-10-CM | POA: Diagnosis not present

## 2022-03-14 DIAGNOSIS — Z85820 Personal history of malignant melanoma of skin: Secondary | ICD-10-CM

## 2022-03-14 DIAGNOSIS — Z23 Encounter for immunization: Secondary | ICD-10-CM

## 2022-03-14 DIAGNOSIS — Z90710 Acquired absence of both cervix and uterus: Secondary | ICD-10-CM

## 2022-03-14 DIAGNOSIS — Z79899 Other long term (current) drug therapy: Secondary | ICD-10-CM | POA: Diagnosis not present

## 2022-03-14 DIAGNOSIS — E785 Hyperlipidemia, unspecified: Secondary | ICD-10-CM | POA: Diagnosis present

## 2022-03-14 DIAGNOSIS — T402X5A Adverse effect of other opioids, initial encounter: Secondary | ICD-10-CM | POA: Diagnosis not present

## 2022-03-15 ENCOUNTER — Emergency Department: Payer: Medicare Other

## 2022-03-15 ENCOUNTER — Inpatient Hospital Stay
Admission: EM | Admit: 2022-03-15 | Discharge: 2022-03-20 | DRG: 481 | Disposition: A | Discharge status: Skilled Nursing Facility | Payer: Medicare Other | Attending: Internal Medicine | Admitting: Internal Medicine

## 2022-03-15 ENCOUNTER — Other Ambulatory Visit: Payer: Self-pay

## 2022-03-15 DIAGNOSIS — R42 Dizziness and giddiness: Secondary | ICD-10-CM | POA: Diagnosis not present

## 2022-03-15 DIAGNOSIS — W010XXA Fall on same level from slipping, tripping and stumbling without subsequent striking against object, initial encounter: Secondary | ICD-10-CM | POA: Diagnosis present

## 2022-03-15 DIAGNOSIS — Z20822 Contact with and (suspected) exposure to covid-19: Secondary | ICD-10-CM | POA: Diagnosis present

## 2022-03-15 DIAGNOSIS — I493 Ventricular premature depolarization: Secondary | ICD-10-CM | POA: Diagnosis present

## 2022-03-15 DIAGNOSIS — D696 Thrombocytopenia, unspecified: Secondary | ICD-10-CM | POA: Diagnosis present

## 2022-03-15 DIAGNOSIS — E785 Hyperlipidemia, unspecified: Secondary | ICD-10-CM | POA: Diagnosis present

## 2022-03-15 DIAGNOSIS — E876 Hypokalemia: Secondary | ICD-10-CM | POA: Diagnosis present

## 2022-03-15 DIAGNOSIS — S72001D Fracture of unspecified part of neck of right femur, subsequent encounter for closed fracture with routine healing: Secondary | ICD-10-CM | POA: Diagnosis not present

## 2022-03-15 DIAGNOSIS — K5903 Drug induced constipation: Secondary | ICD-10-CM | POA: Diagnosis not present

## 2022-03-15 DIAGNOSIS — T402X5A Adverse effect of other opioids, initial encounter: Secondary | ICD-10-CM | POA: Diagnosis not present

## 2022-03-15 DIAGNOSIS — Z85820 Personal history of malignant melanoma of skin: Secondary | ICD-10-CM | POA: Diagnosis not present

## 2022-03-15 DIAGNOSIS — S72001A Fracture of unspecified part of neck of right femur, initial encounter for closed fracture: Secondary | ICD-10-CM | POA: Diagnosis not present

## 2022-03-15 DIAGNOSIS — Z23 Encounter for immunization: Secondary | ICD-10-CM

## 2022-03-15 DIAGNOSIS — I10 Essential (primary) hypertension: Secondary | ICD-10-CM

## 2022-03-15 DIAGNOSIS — Z79899 Other long term (current) drug therapy: Secondary | ICD-10-CM

## 2022-03-15 DIAGNOSIS — Z85828 Personal history of other malignant neoplasm of skin: Secondary | ICD-10-CM | POA: Diagnosis not present

## 2022-03-15 DIAGNOSIS — S72141A Displaced intertrochanteric fracture of right femur, initial encounter for closed fracture: Secondary | ICD-10-CM | POA: Diagnosis present

## 2022-03-15 DIAGNOSIS — Z90710 Acquired absence of both cervix and uterus: Secondary | ICD-10-CM

## 2022-03-15 DIAGNOSIS — K5909 Other constipation: Secondary | ICD-10-CM | POA: Diagnosis not present

## 2022-03-15 DIAGNOSIS — D62 Acute posthemorrhagic anemia: Secondary | ICD-10-CM | POA: Diagnosis not present

## 2022-03-15 DIAGNOSIS — D649 Anemia, unspecified: Secondary | ICD-10-CM | POA: Diagnosis not present

## 2022-03-15 DIAGNOSIS — Y92008 Other place in unspecified non-institutional (private) residence as the place of occurrence of the external cause: Secondary | ICD-10-CM | POA: Diagnosis not present

## 2022-03-15 LAB — COMPREHENSIVE METABOLIC PANEL
ALT: 22 U/L (ref 0–44)
AST: 24 U/L (ref 15–41)
Albumin: 4.3 g/dL (ref 3.5–5.0)
Alkaline Phosphatase: 77 U/L (ref 38–126)
Anion gap: 9 (ref 5–15)
BUN: 15 mg/dL (ref 8–23)
CO2: 24 mmol/L (ref 22–32)
Calcium: 9 mg/dL (ref 8.9–10.3)
Chloride: 107 mmol/L (ref 98–111)
Creatinine, Ser: 0.75 mg/dL (ref 0.44–1.00)
GFR, Estimated: >60 mL/min (ref >60)
Glucose, Bld: 196 mg/dL — ABNORMAL HIGH (ref 70–99)
Potassium: 3.2 mmol/L — ABNORMAL LOW (ref 3.5–5.1)
Sodium: 140 mmol/L (ref 135–145)
Total Bilirubin: 0.7 mg/dL (ref 0.3–1.2)
Total Protein: 6.7 g/dL (ref 6.5–8.1)

## 2022-03-15 LAB — CBC
HCT: 39 % (ref 36.0–46.0)
Hemoglobin: 12.8 g/dL (ref 12.0–15.0)
MCH: 30.8 pg (ref 26.0–34.0)
MCHC: 32.8 g/dL (ref 30.0–36.0)
MCV: 93.8 fL (ref 80.0–100.0)
Platelets: 211 10*3/uL (ref 150–400)
RBC: 4.16 MIL/uL (ref 3.87–5.11)
RDW: 13.1 % (ref 11.5–15.5)
WBC: 7.7 10*3/uL (ref 4.0–10.5)
nRBC: 0 % (ref 0.0–0.2)

## 2022-03-15 LAB — TYPE AND SCREEN
ABO/RH(D): A NEG
Antibody Screen: NEGATIVE

## 2022-03-15 LAB — SARS CORONAVIRUS 2 BY RT PCR: SARS Coronavirus 2 by RT PCR: NEGATIVE

## 2022-03-15 MED ORDER — ACETAMINOPHEN 325 MG PO TABS: 650.0000 mg | ORAL_TABLET | ORAL | Status: DC | PRN

## 2022-03-15 MED ORDER — GABAPENTIN 100 MG PO CAPS
200.0000 mg | ORAL_CAPSULE | Freq: Every day | ORAL | Status: DC
  Administered 2022-03-15 – 2022-03-19 (×5): 200 mg via ORAL
  Filled 2022-03-15 (×5): qty 2

## 2022-03-15 MED ORDER — ONDANSETRON HCL 4 MG/2ML IJ SOLN
4.0000 mg | Freq: Once | INTRAMUSCULAR | Status: AC
  Administered 2022-03-15: 4 mg via INTRAVENOUS
  Filled 2022-03-15: qty 2

## 2022-03-15 MED ORDER — FENTANYL CITRATE PF 50 MCG/ML IJ SOSY
100.0000 ug | PREFILLED_SYRINGE | Freq: Once | INTRAMUSCULAR | Status: AC
  Administered 2022-03-15: 100 ug via INTRAVENOUS
  Filled 2022-03-15: qty 2

## 2022-03-15 MED ORDER — POTASSIUM CHLORIDE 20 MEQ PO PACK
20.0000 meq | PACK | ORAL | Status: AC
  Administered 2022-03-15 (×3): 20 meq via ORAL
  Filled 2022-03-15: qty 1

## 2022-03-15 MED ORDER — MORPHINE SULFATE (PF) 2 MG/ML IV SOLN
0.5000 mg | INTRAVENOUS | Status: DC | PRN
  Administered 2022-03-16: 0.5 mg via INTRAVENOUS
  Filled 2022-03-15: qty 1

## 2022-03-15 MED ORDER — AMLODIPINE BESYLATE 5 MG PO TABS
5.0000 mg | ORAL_TABLET | Freq: Every day | ORAL | Status: DC
  Administered 2022-03-17 – 2022-03-20 (×4): 5 mg via ORAL
  Filled 2022-03-15 (×4): qty 1

## 2022-03-15 MED ORDER — SODIUM CHLORIDE 0.9 % IV BOLUS
500.0000 mL | Freq: Once | INTRAVENOUS | Status: AC
  Administered 2022-03-15: 500 mL via INTRAVENOUS

## 2022-03-15 MED ORDER — ONDANSETRON HCL 4 MG/2ML IJ SOLN
4.0000 mg | Freq: Once | INTRAMUSCULAR | Status: DC
  Filled 2022-03-15: qty 2

## 2022-03-15 MED ORDER — MAGNESIUM HYDROXIDE 400 MG/5ML PO SUSP: 30.0000 mL | Freq: Every day | ORAL | Status: DC | PRN

## 2022-03-15 MED ORDER — TRAZODONE HCL 50 MG PO TABS
25.0000 mg | ORAL_TABLET | Freq: Every evening | ORAL | Status: DC | PRN
  Administered 2022-03-17 – 2022-03-19 (×3): 25 mg via ORAL
  Filled 2022-03-15 (×3): qty 1

## 2022-03-15 MED ORDER — ONDANSETRON HCL 4 MG/2ML IJ SOLN
4.0000 mg | INTRAMUSCULAR | Status: DC | PRN
  Administered 2022-03-16: 4 mg via INTRAVENOUS
  Filled 2022-03-15: qty 2

## 2022-03-15 MED ORDER — CEFAZOLIN SODIUM-DEXTROSE 2-4 GM/100ML-% IV SOLN
2.0000 g | INTRAVENOUS | Status: DC
  Filled 2022-03-15: qty 100

## 2022-03-15 MED ORDER — SODIUM CHLORIDE 0.9 % IV SOLN: INTRAVENOUS | Status: DC

## 2022-03-15 MED ORDER — HYDROCODONE-ACETAMINOPHEN 5-325 MG PO TABS
1.0000 | ORAL_TABLET | Freq: Four times a day (QID) | ORAL | Status: DC | PRN
  Administered 2022-03-15: 2 via ORAL
  Administered 2022-03-16 – 2022-03-18 (×2): 1 via ORAL
  Administered 2022-03-19 – 2022-03-20 (×3): 2 via ORAL
  Filled 2022-03-15: qty 1
  Filled 2022-03-15 (×2): qty 2
  Filled 2022-03-15: qty 1
  Filled 2022-03-15 (×3): qty 2

## 2022-03-15 MED ORDER — INFLUENZA VAC A&B SA ADJ QUAD 0.5 ML IM PRSY
0.5000 mL | PREFILLED_SYRINGE | Freq: Once | INTRAMUSCULAR | Status: AC
Start: 2022-03-17 — End: 2022-03-17
  Administered 2022-03-17: 0.5 mL via INTRAMUSCULAR
  Filled 2022-03-15: qty 0.5

## 2022-03-15 MED ORDER — PRAVASTATIN SODIUM 20 MG PO TABS
40.0000 mg | ORAL_TABLET | Freq: Every day | ORAL | Status: DC
  Administered 2022-03-15 – 2022-03-20 (×5): 40 mg via ORAL
  Filled 2022-03-15 (×5): qty 2

## 2022-03-15 NOTE — Assessment & Plan Note (Signed)
-   We will continue amlodipine. 

## 2022-03-15 NOTE — ED Triage Notes (Signed)
Mech fall, right hip pain , right hip short, no thinners, 18 r fa,

## 2022-03-15 NOTE — Assessment & Plan Note (Signed)
-   We will continue statin therapy. 

## 2022-03-15 NOTE — Consult Note (Signed)
ORTHOPAEDIC CONSULTATION  REQUESTING PHYSICIAN: Mansy, Arvella Merles, MD  Chief Complaint:   Right hip pain.  History of Present Illness: Angela Trevino is a 88 y.o. female with a history of skin cancers and peripheral neuropathy, but in otherwise excellent health who lives independently and ambulates with a cane.  Apparently, the patient was in her usual state of health when she apparently bent over to pick up her cane which she had dropped on the floor.  She lost her balance and fell onto her right hip.  She was brought to the emergency room where x-rays demonstrated an intertrochanteric fracture of the right hip.  The patient denies any associated injuries.  She did not strike her head or lose consciousness.  The patient also denies any lightheadedness, dizziness, chest pain, shortness of breath, or other symptoms which may have precipitated her fall.  Past Medical History:  Diagnosis Date   Actinic keratosis    Basal cell carcinoma 05/17/2019   R post neck   Melanoma ([place]) 05/31/2019   left lat knee BRESLOW'S DEPTH/MAXIMUM TUMOR THICKNESS: 0.7 MM. Excised 07/05/2019, margins free.   History reviewed. No pertinent surgical history. Social History   Socioeconomic History   Marital status: Widowed    Spouse name: Not on file   Number of children: Not on file   Years of education: Not on file   Highest education level: Not on file  Occupational History   Not on file  Tobacco Use   Smoking status: Never   Smokeless tobacco: Never  Substance and Sexual Activity   Alcohol use: Not on file   Drug use: Not on file   Sexual activity: Not on file  Other Topics Concern   Not on file  Social History Narrative   Not on file   Social Determinants of Health   Financial Resource Strain: Not on file  Food Insecurity: Not on file  Transportation Needs: Not on file  Physical Activity: Not on file  Stress: Not on file  Social  Connections: Not on file   Family History  Problem Relation Age of Onset   Breast cancer Neg Hx    Allergies  Allergen Reactions   Lovastatin     Other reaction(s): Unknown   Rosuvastatin     Other reaction(s): Unknown   Simvastatin     Other reaction(s): Unknown   Prior to Admission medications   Medication Sig Start Date End Date Taking? Authorizing Provider  amLODipine (NORVASC) 5 MG tablet Take 5 mg by mouth daily. 01/10/20  Yes [provider]  gabapentin (NEURONTIN) 100 MG capsule Take 200 mg by mouth at bedtime. 01/10/20  Yes [provider]  pravastatin (PRAVACHOL) 40 MG tablet Take 40 mg by mouth daily. 01/06/20  Yes [provider]  ammonium lactate (AMLACTIN) 12 % cream Apply 1 application topically daily. Qd to legs and arms 05/07/21   [name], MD  meclizine (ANTIVERT) 12.5 MG tablet Take 12.5 mg by mouth 3 (three) times daily as needed. Patient not taking: Reported on 03/15/2022 12/11/21   [provider]  triamcinolone (KENALOG) 0.1 % Apply 1 application topically 2 (two) times daily as needed (Rash). Apply to shoulders and back Avoid applying to face, groin, and axilla. Use as directed. Risk of skin atrophy with long-term use reviewed. 08/22/20   [name], MD   DG Chest Portable 1 View  Result Date: 03/15/2022 CLINICAL DATA:  Preop hip fracture EXAM: PORTABLE CHEST 1 VIEW COMPARISON:  None Available. FINDINGS:  The heart size and mediastinal contours are within normal limits. Both lungs are clear. The visualized skeletal structures are unremarkable. IMPRESSION: No active disease. Electronically Signed   By: Donavan Foil M.D.   On: 03/15/2022 18:29   DG HIP UNILAT W OR W/O PELVIS 2-3 VIEWS RIGHT  Result Date: 03/15/2022 CLINICAL DATA:  Hip fracture EXAM: DG HIP (WITH OR WITHOUT PELVIS) 2-3V RIGHT COMPARISON:  None Available. FINDINGS: SI joints are non widened. Pubic symphysis and rami appear intact. Acute comminuted and displaced  intertrochanteric fracture with involvement of the subtrochanteric shaft of the femur. Mild apex lateral angulation. No femoral head dislocation IMPRESSION: Acute comminuted and displaced proximal femoral fracture involves the intertrochanteric region and subtrochanteric shaft Electronically Signed   By: Donavan Foil M.D.   On: 03/15/2022 18:28   MM 3D SCREEN BREAST BILATERAL  Result Date: 03/15/2022 CLINICAL DATA:  Screening. EXAM: DIGITAL SCREENING BILATERAL MAMMOGRAM WITH TOMOSYNTHESIS AND CAD TECHNIQUE: Bilateral screening digital craniocaudal and mediolateral oblique mammograms were obtained. Bilateral screening digital breast tomosynthesis was performed. The images were evaluated with computer-aided detection. COMPARISON:  Previous exam(s). ACR Breast Density Category c: The breast tissue is heterogeneously dense, which may obscure small masses. FINDINGS: There are no findings suspicious for malignancy. IMPRESSION: No mammographic evidence of malignancy. A result letter of this screening mammogram will be mailed directly to the patient. RECOMMENDATION: Screening mammogram in one year. (Code:SM-B-01Y) BI-RADS CATEGORY  1: Negative. Electronically Signed   By: Margarette Canada M.D.   On: 03/15/2022 13:04    Positive ROS: All other systems have been reviewed and were otherwise negative with the exception of those mentioned in the HPI and as above.  Physical Exam: General:  Alert, no acute distress Psychiatric:  Patient is competent for consent with normal mood and affect   Cardiovascular:  No pedal edema Respiratory:  No wheezing, non-labored breathing GI:  Abdomen is soft and non-tender Skin:  No lesions in the area of chief complaint Neurologic:  Sensation intact distally Lymphatic:  No axillary or cervical lymphadenopathy  Orthopedic Exam:  Orthopedic examination is limited to the right hip and lower extremity.  The right lower extremity is somewhat shortened and externally rotated as compared to  the left.  Skin inspection around the right hip is unremarkable.  No swelling, erythema, ecchymosis, abrasions, or other skin abnormalities are identified.  She has mild-moderate tenderness to palpation over the lateral aspect of the right hip.  She has more severe pain with any attempted active or passive motion of the right hip.  She is grossly neurovascularly intact to the right lower extremity and foot, demonstrating the ability to dorsiflex and plantarflex her toes and ankle.  Sensations intact light touch to all distributions.  She has good capillary refill to the right foot.  X-rays:  X-rays of the pelvis and right hip from earlier today are available for review and have been reviewed by myself.  These films demonstrate a displaced reverse obliquity intertrochanteric fracture of the right hip.  No significant degenerative changes of the right hip joint are noted.  No lytic lesions or other acute bony abnormalities are identified.  Assessment: Displaced reverse obliquity intertrochanteric fracture of right hip.  Plan: The treatment options, including both surgical and nonsurgical choices, have been discussed in detail with the patient and her family.  The patient would like to proceed with surgical intervention to include an intramedullary nailing of the displaced right hip fracture.  The risks (including bleeding, infection, nerve and/or blood vessel  injury, persistent or recurrent pain, loosening or failure of the components, leg length inequality, malunion and/or nonunion, need for further surgery, blood clots, strokes, heart attacks or arrhythmias, pneumonia, etc.) and benefits of the surgical procedure were discussed.  The patient states her understanding and agrees to proceed.  A formal written consent will be obtained by the nursing staff.  Thank you for asking me to participate in the care of this most delightful woman.  I will be happy to follow her with you.   Pascal Lux,  MD  Beeper #:  [phone]  03/15/2022 9:14 PM

## 2022-03-15 NOTE — Assessment & Plan Note (Signed)
-   This is secondary to mechanical fall. - The patient will be admitted to an medical-surgical bed. - Pain management will be provided. - Orthopedic consult will be obtained. - I notified Dr.Poggi about the patient. - She will be kept n.p.o. after midnight. - The patient has no history of CHF, CAD, CVA, diabetes mellitus on insulin or renal failure with a creatinine more than 2.  She is considered at average risk for her age for perioperative cardiovascular events per the revised cardiac risk index.  She has no current pulmonary issues.

## 2022-03-15 NOTE — ED Notes (Signed)
1A RN states purple man has been changed to green status and is ready to receive patient, unable to see status on ED trackboard.

## 2022-03-15 NOTE — ED Provider Notes (Signed)
[facility] Provider Note    Event Date/Time   First MD Initiated Contact with Patient 03/15/22 1739     (approximate)  History   Chief Complaint: Fall (Right hip[ short, right hip pain , mech fall, no thinners, )  HPI  Angela Trevino is a 88 y.o. female with no significant past medical history.  No anticoagulation, presents to the emergency department for right hip pain after a fall.  According to the patient her walking stick fell over she was sitting in a chair and she reached over to try to pick it up however she fell backwards missing the chair landing on her right hip and buttocks.  Patient denies hitting her head denies LOC denies headache.  Patient is complaining of significant right hip pain as well as nausea vomited several times with EMS.  Physical Exam   Triage Vital Signs: ED Triage Vitals  Enc Vitals Group     BP 03/15/22 1741 (!) 174/77     Pulse Rate 03/15/22 1741 86     Resp 03/15/22 1741 17     Temp 03/15/22 1737 97.7 F (36.5 C)     Temp Source 03/15/22 1737 Oral     SpO2 03/15/22 1741 98 %     Weight --      Height --      Head Circumference --      Peak Flow --      Pain Score 03/15/22 1739 10     Pain Loc --      Pain Edu? --      Excl. in [place]? --     Most recent vital signs: Vitals:   03/15/22 1737 03/15/22 1741  BP:  (!) 174/77  Pulse:  86  Resp:  17  Temp: 97.7 F (36.5 C)   SpO2:  98%    General: Awake, no distress.  CV:  Good peripheral perfusion.  Regular rate and rhythm  Resp:  Normal effort.  Equal breath sounds bilaterally.  Abd:  No distention.  Soft, nontender.  No rebound or guarding. Other:  Moderate tenderness palpation to the right hip.  Neurovascular intact distally with 2+ DP pulse and normal sensation.   ED Results / Procedures / Treatments   EKG  EKG viewed and interpreted by myself shows a normal sinus rhythm at 80 bpm with a narrow QRS, normal axis, normal intervals, nonspecific ST  changes.  RADIOLOGY  I have reviewed and interpreted the x-ray images.  Patient appears to have a significant hip fracture appears to be intertrochanteric fracture. Chest x-ray negative.  MEDICATIONS ORDERED IN ED: Medications - No data to display   IMPRESSION / MDM / [place] / ED COURSE  I reviewed the triage vital signs and the nursing notes.  Patient's presentation is most consistent with acute presentation with potential threat to life or bodily function.  Patient presents emergency department for right hip pain after mechanical fall.  No head injury LOC or anticoagulation.  We will obtain x-ray images of the right hip to further evaluate.  Neurovascular intact distally.  We will check preop labs EKG and chest x-ray as well.  Patient is nauseated we will dose Zofran.  Patient agreeable to plan.  Patients x-ray is consistent with a right intertrochanteric fracture.  I spoke to Dr. Roland Rack of orthopedics who will plan on fixing the hip tomorrow.  We will admit to the hospital service n.p.o. after midnight.  Remainder the patient's lab work  is reassuring with a normal CBC reassuring chemistry, EKG shows no concerning findings and chest x-ray is clear.  COVID test is negative  FINAL CLINICAL IMPRESSION(S) / ED DIAGNOSES   Right hip fracture    Note:  This document was prepared using Dragon voice recognition software and may include unintentional dictation errors.   Harvest Dark, MD 03/15/22 2322

## 2022-03-15 NOTE — H&P (Signed)
[name]   PATIENT NAME: Angela Trevino    MR#:  [id number]  DATE OF BIRTH:  October 14, 1926  DATE OF ADMISSION:  03/15/2022  PRIMARY CARE PHYSICIAN: Angela Pink, MD   Patient is coming from: Home  REQUESTING/REFERRING PHYSICIAN: Harvest Dark, MD  CHIEF COMPLAINT:   Chief Complaint  Patient presents with   Fall    Right hip[ short, right hip pain , mech fall, no thinners,     HISTORY OF PRESENT ILLNESS:  Angela Trevino is a 88 y.o. female with medical history significant for hypertension and dyslipidemia, who presented to the ER with acute onset of accidental mechanical fall.  The patient was sitting outside on her patio in a chair and when she got up and bent to get her walker she lost balance and fell on her right side.  She had subsequent right hip pain and inability to move.  She denies any presyncope or syncope.  She denied any paresthesias or focal muscle weakness.  No chest pain or palpitations.  No recent cough or wheezing or dyspnea.  No fever or chills.  No dysuria, oliguria or hematuria or flank pain.  ED Course: When she came to the ER, BP was 174/77 with otherwise normal vital signs.  Labs revealed mild hypokalemia 3.2 with otherwise unremarkable CMP and CBC was within normal.  Blood group was a negative with negative antibody screen.  COVID-19 PCR came back negative EKG as reviewed by me : EKG showed normal sinus rhythm with a rate of 80 with PVCs and probable left atrial enlargement. Imaging: Portable chest x-ray showed no acute cardiopulmonary disease.  Right hip and pelvic x-ray a revealed acute comminuted and displaced proximal femoral fracture involving the intertrochanteric region and subtrochanteric shaft. PAST MEDICAL HISTORY:   Past Medical History:  Diagnosis Date   Actinic keratosis    Basal cell carcinoma 05/17/2019   R post neck   Melanoma ([place]) 05/31/2019   left lat knee BRESLOW'S DEPTH/MAXIMUM TUMOR THICKNESS: 0.7 MM. Excised  07/05/2019, margins free.  Hypertension and dyslipidemia.  PAST SURGICAL HISTORY:  Hysterectomy  SOCIAL HISTORY:   Social History   Tobacco Use   Smoking status: Never   Smokeless tobacco: Never  Substance Use Topics   Alcohol use: Not on file    FAMILY HISTORY:   Family History  Problem Relation Age of Onset   Breast cancer Neg Hx     DRUG ALLERGIES:   Allergies  Allergen Reactions   Lovastatin     Other reaction(s): Unknown   Rosuvastatin     Other reaction(s): Unknown   Simvastatin     Other reaction(s): Unknown    REVIEW OF SYSTEMS:   ROS As per history of present illness. All pertinent systems were reviewed above. Constitutional, HEENT, cardiovascular, respiratory, GI, GU, musculoskeletal, neuro, psychiatric, endocrine, integumentary and hematologic systems were reviewed and are otherwise negative/unremarkable except for positive findings mentioned above in the HPI.   MEDICATIONS AT HOME:   Prior to Admission medications   Medication Sig Start Date End Date Taking? Authorizing Provider  amLODipine (NORVASC) 5 MG tablet Take 5 mg by mouth daily. 01/10/20  Yes [provider]  gabapentin (NEURONTIN) 100 MG capsule Take 200 mg by mouth at bedtime. 01/10/20  Yes [provider]  pravastatin (PRAVACHOL) 40 MG tablet Take 40 mg by mouth daily. 01/06/20  Yes [provider]  ammonium lactate (AMLACTIN) 12 % cream Apply 1 application topically daily. Qd to legs and  arms 05/07/21   [name], MD  meclizine (ANTIVERT) 12.5 MG tablet Take 12.5 mg by mouth 3 (three) times daily as needed. Patient not taking: Reported on 03/15/2022 12/11/21   [provider]  triamcinolone (KENALOG) 0.1 % Apply 1 application topically 2 (two) times daily as needed (Rash). Apply to shoulders and back Avoid applying to face, groin, and axilla. Use as directed. Risk of skin atrophy with long-term use reviewed. 08/22/20   [name], MD      VITAL SIGNS:   Blood pressure (!) 158/65, pulse 79, temperature 99 F (37.2 C), temperature source Oral, resp. rate 18, height '5\' 4"'$  (1.626 m), weight 59.9 kg, SpO2 100 %.  PHYSICAL EXAMINATION:  Physical Exam  GENERAL:  88 y.o.-year-old Caucasian female patient lying in the bed with no acute distress.  EYES: Pupils equal, round, reactive to light and accommodation. No scleral icterus. Extraocular muscles intact.  HEENT: Head atraumatic, normocephalic. Oropharynx and nasopharynx clear.  NECK:  Supple, no jugular venous distention. No thyroid enlargement, no tenderness.  LUNGS: Normal breath sounds bilaterally, no wheezing, rales,rhonchi or crepitation. No use of accessory muscles of respiration.  CARDIOVASCULAR: Regular rate and rhythm, S1, S2 normal. No murmurs, rubs, or gallops.  ABDOMEN: Soft, nondistended, nontender. Bowel sounds present. No organomegaly or mass.  EXTREMITIES: No pedal edema, cyanosis, or clubbing. Musculoskeletal: Right lateral hip tenderness. NEUROLOGIC: Cranial nerves II through XII are intact. Muscle strength 5/5 in all extremities. Sensation intact. Gait not checked.  PSYCHIATRIC: The patient is alert and oriented x 3.  Normal affect and good eye contact. SKIN: No obvious rash, lesion, or ulcer.   LABORATORY PANEL:   CBC Recent Labs  Lab 03/15/22 1738  WBC 7.7  HGB 12.8  HCT 39.0  PLT 211   ------------------------------------------------------------------------------------------------------------------  Chemistries  Recent Labs  Lab 03/15/22 1738  NA 140  K 3.2*  CL 107  CO2 24  GLUCOSE 196*  BUN 15  CREATININE 0.75  CALCIUM 9.0  AST 24  ALT 22  ALKPHOS 77  BILITOT 0.7   ------------------------------------------------------------------------------------------------------------------  Cardiac Enzymes No results for input(s): "TROPONINI" in the last 168  hours. ------------------------------------------------------------------------------------------------------------------  RADIOLOGY:  DG Chest Portable 1 View  Result Date: 03/15/2022 CLINICAL DATA:  Preop hip fracture EXAM: PORTABLE CHEST 1 VIEW COMPARISON:  None Available. FINDINGS: The heart size and mediastinal contours are within normal limits. Both lungs are clear. The visualized skeletal structures are unremarkable. IMPRESSION: No active disease. Electronically Signed   By: Donavan Foil M.D.   On: 03/15/2022 18:29   DG HIP UNILAT W OR W/O PELVIS 2-3 VIEWS RIGHT  Result Date: 03/15/2022 CLINICAL DATA:  Hip fracture EXAM: DG HIP (WITH OR WITHOUT PELVIS) 2-3V RIGHT COMPARISON:  None Available. FINDINGS: SI joints are non widened. Pubic symphysis and rami appear intact. Acute comminuted and displaced intertrochanteric fracture with involvement of the subtrochanteric shaft of the femur. Mild apex lateral angulation. No femoral head dislocation IMPRESSION: Acute comminuted and displaced proximal femoral fracture involves the intertrochanteric region and subtrochanteric shaft Electronically Signed   By: Donavan Foil M.D.   On: 03/15/2022 18:28   MM 3D SCREEN BREAST BILATERAL  Result Date: 03/15/2022 CLINICAL DATA:  Screening. EXAM: DIGITAL SCREENING BILATERAL MAMMOGRAM WITH TOMOSYNTHESIS AND CAD TECHNIQUE: Bilateral screening digital craniocaudal and mediolateral oblique mammograms were obtained. Bilateral screening digital breast tomosynthesis was performed. The images were evaluated with computer-aided detection. COMPARISON:  Previous exam(s). ACR Breast Density Category c: The breast tissue is heterogeneously dense, which may obscure  small masses. FINDINGS: There are no findings suspicious for malignancy. IMPRESSION: No mammographic evidence of malignancy. A result letter of this screening mammogram will be mailed directly to the patient. RECOMMENDATION: Screening mammogram in one year.  (Code:SM-B-01Y) BI-RADS CATEGORY  1: Negative. Electronically Signed   By: Margarette Canada M.D.   On: 03/15/2022 13:04      IMPRESSION AND PLAN:  Assessment and Plan: * Closed right hip fracture (HCC) - This is secondary to mechanical fall. - The patient will be admitted to an medical-surgical bed. - Pain management will be provided. - Orthopedic consult will be obtained. - I notified Dr.Poggi about the patient. - She will be kept n.p.o. after midnight. - The patient has no history of CHF, CAD, CVA, diabetes mellitus on insulin or renal failure with a creatinine more than 2.  She is considered at average risk for her age for perioperative cardiovascular events per the revised cardiac risk index.  She has no current pulmonary issues.  Hypokalemia - We will replace potassium and check magnesium level.  Dyslipidemia - We will continue statin therapy.  Essential hypertension - We will continue amlodipine.    DVT prophylaxis: SCDs.  Medical prophylaxis postponed to postoperative. Advanced Care Planning:  Code Status: full code. Family Communication:  The plan of care was discussed in details with the patient (and family). I answered all questions. The patient agreed to proceed with the above mentioned plan. Further management will depend upon hospital course. Disposition Plan: Back to previous home environment Consults called: Ortho consult. All the records are reviewed and case discussed with ED provider.  Status is: Inpatient   At the time of the admission, it appears that the appropriate admission status for this patient is inpatient.  This is judged to be reasonable and necessary in order to provide the required intensity of service to ensure the patient's safety given the presenting symptoms, physical exam findings and initial radiographic and laboratory data in the context of comorbid conditions.  The patient requires inpatient status due to high intensity of service, high risk of  further deterioration and high frequency of surveillance required.  I certify that at the time of admission, it is my clinical judgment that the patient will require inpatient hospital care extending more than 2 midnights.                            Dispo: The patient is from: Home              Anticipated d/c is to: Home              Patient currently is not medically stable to d/c.              Difficult to place patient: No  Christel Mormon M.D on 03/15/2022 at 10:10 PM  [facility]   From 7 PM-7 AM, contact night-coverage [email or web address]  CC: Primary care physician; Angela Pink, MD

## 2022-03-15 NOTE — Assessment & Plan Note (Signed)
-  We will replace potassium and check magnesium level. 

## 2022-03-15 NOTE — ED Notes (Signed)
Ems administered ivp 100 mc fentanyl

## 2022-03-16 ENCOUNTER — Other Ambulatory Visit: Payer: Self-pay

## 2022-03-16 ENCOUNTER — Encounter
Admission: EM | Disposition: A | Discharge status: Skilled Nursing Facility | Payer: Self-pay | Source: Home / Self Care | Attending: Internal Medicine

## 2022-03-16 ENCOUNTER — Inpatient Hospital Stay: Payer: Medicare Other | Admitting: Anesthesiology

## 2022-03-16 ENCOUNTER — Inpatient Hospital Stay: Payer: Medicare Other

## 2022-03-16 DIAGNOSIS — E876 Hypokalemia: Secondary | ICD-10-CM | POA: Diagnosis not present

## 2022-03-16 DIAGNOSIS — I10 Essential (primary) hypertension: Secondary | ICD-10-CM | POA: Diagnosis not present

## 2022-03-16 DIAGNOSIS — S72001A Fracture of unspecified part of neck of right femur, initial encounter for closed fracture: Secondary | ICD-10-CM | POA: Diagnosis not present

## 2022-03-16 HISTORY — PX: INTRAMEDULLARY (IM) NAIL INTERTROCHANTERIC: SHX5875

## 2022-03-16 LAB — CBC
HCT: 32.5 % — ABNORMAL LOW (ref 36.0–46.0)
Hemoglobin: 10.7 g/dL — ABNORMAL LOW (ref 12.0–15.0)
MCH: 31.5 pg (ref 26.0–34.0)
MCHC: 32.9 g/dL (ref 30.0–36.0)
MCV: 95.6 fL (ref 80.0–100.0)
Platelets: 155 10*3/uL (ref 150–400)
RBC: 3.4 MIL/uL — ABNORMAL LOW (ref 3.87–5.11)
RDW: 13.1 % (ref 11.5–15.5)
WBC: 7.1 10*3/uL (ref 4.0–10.5)
nRBC: 0 % (ref 0.0–0.2)

## 2022-03-16 LAB — BASIC METABOLIC PANEL
Anion gap: 7 (ref 5–15)
BUN: 11 mg/dL (ref 8–23)
CO2: 26 mmol/L (ref 22–32)
Calcium: 8.2 mg/dL — ABNORMAL LOW (ref 8.9–10.3)
Chloride: 104 mmol/L (ref 98–111)
Creatinine, Ser: 0.62 mg/dL (ref 0.44–1.00)
GFR, Estimated: >60 mL/min (ref >60)
Glucose, Bld: 131 mg/dL — ABNORMAL HIGH (ref 70–99)
Potassium: 4.1 mmol/L (ref 3.5–5.1)
Sodium: 137 mmol/L (ref 135–145)

## 2022-03-16 LAB — SURGICAL PCR SCREEN
MRSA, PCR: NEGATIVE
Staphylococcus aureus: NEGATIVE

## 2022-03-16 SURGERY — FIXATION, FRACTURE, INTERTROCHANTERIC, WITH INTRAMEDULLARY ROD
Anesthesia: General | Site: Hip | Laterality: Right

## 2022-03-16 MED ORDER — 0.9 % SODIUM CHLORIDE (POUR BTL) OPTIME
TOPICAL | Status: DC | PRN
  Administered 2022-03-16: 500 mL

## 2022-03-16 MED ORDER — FLEET ENEMA 7-19 GM/118ML RE ENEM: 1.0000 | ENEMA | Freq: Once | RECTAL | Status: DC | PRN

## 2022-03-16 MED ORDER — DIPHENHYDRAMINE HCL 12.5 MG/5ML PO ELIX: 12.5000 mg | ORAL_SOLUTION | ORAL | Status: DC | PRN

## 2022-03-16 MED ORDER — ENOXAPARIN SODIUM 40 MG/0.4ML IJ SOSY
40.0000 mg | PREFILLED_SYRINGE | INTRAMUSCULAR | Status: DC
  Administered 2022-03-17 – 2022-03-20 (×4): 40 mg via SUBCUTANEOUS
  Filled 2022-03-16 (×4): qty 0.4

## 2022-03-16 MED ORDER — FENTANYL CITRATE (PF) 100 MCG/2ML IJ SOLN: 25.0000 ug | INTRAMUSCULAR | Status: DC | PRN

## 2022-03-16 MED ORDER — ROCURONIUM BROMIDE 10 MG/ML (PF) SYRINGE
PREFILLED_SYRINGE | INTRAVENOUS | Status: AC
  Filled 2022-03-16: qty 10

## 2022-03-16 MED ORDER — OXYCODONE HCL 5 MG PO TABS: 5.0000 mg | ORAL_TABLET | Freq: Once | ORAL | Status: DC | PRN

## 2022-03-16 MED ORDER — ONDANSETRON HCL 4 MG/2ML IJ SOLN
INTRAMUSCULAR | Status: AC
  Filled 2022-03-16: qty 2

## 2022-03-16 MED ORDER — FENTANYL CITRATE (PF) 100 MCG/2ML IJ SOLN
INTRAMUSCULAR | Status: DC | PRN
Start: 2022-03-16 — End: 2022-03-16
  Administered 2022-03-16 (×4): 25 ug via INTRAVENOUS

## 2022-03-16 MED ORDER — BISACODYL 10 MG RE SUPP
10.0000 mg | Freq: Every day | RECTAL | Status: DC | PRN
  Administered 2022-03-19: 10 mg via RECTAL
  Filled 2022-03-16 (×2): qty 1

## 2022-03-16 MED ORDER — ACETAMINOPHEN 10 MG/ML IV SOLN
INTRAVENOUS | Status: AC
  Filled 2022-03-16: qty 100

## 2022-03-16 MED ORDER — KETAMINE HCL 50 MG/5ML IJ SOSY
PREFILLED_SYRINGE | INTRAMUSCULAR | Status: AC
  Filled 2022-03-16: qty 5

## 2022-03-16 MED ORDER — EPHEDRINE SULFATE (PRESSORS) 50 MG/ML IJ SOLN
INTRAMUSCULAR | Status: DC | PRN
  Administered 2022-03-16 (×2): 7.5 mg via INTRAVENOUS
  Administered 2022-03-16: 5 mg via INTRAVENOUS

## 2022-03-16 MED ORDER — FENTANYL CITRATE (PF) 100 MCG/2ML IJ SOLN
INTRAMUSCULAR | Status: AC
  Filled 2022-03-16: qty 2

## 2022-03-16 MED ORDER — ROCURONIUM BROMIDE 100 MG/10ML IV SOLN
INTRAVENOUS | Status: DC | PRN
  Administered 2022-03-16: 30 mg via INTRAVENOUS

## 2022-03-16 MED ORDER — METOCLOPRAMIDE HCL 5 MG/ML IJ SOLN: 5.0000 mg | Freq: Three times a day (TID) | INTRAMUSCULAR | Status: DC | PRN

## 2022-03-16 MED ORDER — OXYCODONE HCL 5 MG/5ML PO SOLN: 5.0000 mg | Freq: Once | ORAL | Status: DC | PRN

## 2022-03-16 MED ORDER — BUPIVACAINE-EPINEPHRINE (PF) 0.5% -1:200000 IJ SOLN
INTRAMUSCULAR | Status: DC | PRN
  Administered 2022-03-16: 30 mL

## 2022-03-16 MED ORDER — SUGAMMADEX SODIUM 200 MG/2ML IV SOLN
INTRAVENOUS | Status: DC | PRN
  Administered 2022-03-16: 200 mg via INTRAVENOUS

## 2022-03-16 MED ORDER — TRANEXAMIC ACID-NACL 1000-0.7 MG/100ML-% IV SOLN
INTRAVENOUS | Status: DC | PRN
  Administered 2022-03-16: 1000 mg via INTRAVENOUS

## 2022-03-16 MED ORDER — CEFAZOLIN SODIUM-DEXTROSE 2-3 GM-%(50ML) IV SOLR
INTRAVENOUS | Status: DC | PRN
  Administered 2022-03-16: 2 g via INTRAVENOUS

## 2022-03-16 MED ORDER — TRANEXAMIC ACID 1000 MG/10ML IV SOLN
INTRAVENOUS | Status: AC
  Filled 2022-03-16: qty 10

## 2022-03-16 MED ORDER — PROPOFOL 10 MG/ML IV BOLUS
INTRAVENOUS | Status: DC | PRN
  Administered 2022-03-16: 60 mg via INTRAVENOUS
  Administered 2022-03-16: 100 ug/kg/min via INTRAVENOUS

## 2022-03-16 MED ORDER — METOCLOPRAMIDE HCL 5 MG PO TABS: 5.0000 mg | ORAL_TABLET | Freq: Three times a day (TID) | ORAL | Status: DC | PRN

## 2022-03-16 MED ORDER — LIDOCAINE HCL (CARDIAC) PF 100 MG/5ML IV SOSY
PREFILLED_SYRINGE | INTRAVENOUS | Status: DC | PRN
  Administered 2022-03-16: 100 mg via INTRAVENOUS

## 2022-03-16 MED ORDER — ADULT MULTIVITAMIN W/MINERALS CH
1.0000 | ORAL_TABLET | Freq: Every day | ORAL | Status: DC
  Administered 2022-03-17 – 2022-03-20 (×4): 1 via ORAL
  Filled 2022-03-16 (×5): qty 1

## 2022-03-16 MED ORDER — MAGNESIUM HYDROXIDE 400 MG/5ML PO SUSP
30.0000 mL | Freq: Every day | ORAL | Status: DC | PRN
  Administered 2022-03-18 – 2022-03-19 (×2): 30 mL via ORAL
  Filled 2022-03-16 (×2): qty 30

## 2022-03-16 MED ORDER — DEXAMETHASONE SODIUM PHOSPHATE 10 MG/ML IJ SOLN
INTRAMUSCULAR | Status: DC | PRN
  Administered 2022-03-16: 5 mg via INTRAVENOUS

## 2022-03-16 MED ORDER — CEFAZOLIN SODIUM-DEXTROSE 2-4 GM/100ML-% IV SOLN
2.0000 g | Freq: Four times a day (QID) | INTRAVENOUS | Status: AC
  Administered 2022-03-16 – 2022-03-17 (×3): 2 g via INTRAVENOUS
  Filled 2022-03-16 (×3): qty 100

## 2022-03-16 MED ORDER — ACETAMINOPHEN 325 MG PO TABS
325.0000 mg | ORAL_TABLET | Freq: Four times a day (QID) | ORAL | Status: DC | PRN
  Administered 2022-03-17 – 2022-03-19 (×3): 650 mg via ORAL
  Filled 2022-03-16 (×4): qty 2

## 2022-03-16 MED ORDER — ACETAMINOPHEN 10 MG/ML IV SOLN
INTRAVENOUS | Status: DC | PRN
  Administered 2022-03-16: 1000 mg via INTRAVENOUS

## 2022-03-16 MED ORDER — DEXMEDETOMIDINE HCL IN NACL 400 MCG/100ML IV SOLN
INTRAVENOUS | Status: DC | PRN
  Administered 2022-03-16 (×3): 4 ug via INTRAVENOUS

## 2022-03-16 MED ORDER — ACETAMINOPHEN 500 MG PO TABS
500.0000 mg | ORAL_TABLET | Freq: Four times a day (QID) | ORAL | Status: AC
  Administered 2022-03-16 – 2022-03-17 (×4): 500 mg via ORAL
  Filled 2022-03-16 (×4): qty 1

## 2022-03-16 MED ORDER — ONDANSETRON HCL 4 MG/2ML IJ SOLN: 4.0000 mg | Freq: Four times a day (QID) | INTRAMUSCULAR | Status: DC | PRN

## 2022-03-16 MED ORDER — ENSURE ENLIVE PO LIQD
237.0000 mL | Freq: Two times a day (BID) | ORAL | Status: DC
  Administered 2022-03-16 – 2022-03-20 (×6): 237 mL via ORAL
  Filled 2022-03-16: qty 237

## 2022-03-16 MED ORDER — DOCUSATE SODIUM 100 MG PO CAPS
100.0000 mg | ORAL_CAPSULE | Freq: Two times a day (BID) | ORAL | Status: DC
  Administered 2022-03-16 – 2022-03-18 (×5): 100 mg via ORAL
  Filled 2022-03-16 (×5): qty 1

## 2022-03-16 MED ORDER — SUCCINYLCHOLINE CHLORIDE 200 MG/10ML IV SOSY
PREFILLED_SYRINGE | INTRAVENOUS | Status: AC
  Filled 2022-03-16: qty 10

## 2022-03-16 MED ORDER — KETAMINE HCL 10 MG/ML IJ SOLN
INTRAMUSCULAR | Status: DC | PRN
  Administered 2022-03-16 (×2): 5 mg via INTRAVENOUS
  Administered 2022-03-16: 15 mg via INTRAVENOUS

## 2022-03-16 MED ORDER — SUCCINYLCHOLINE CHLORIDE 200 MG/10ML IV SOSY
PREFILLED_SYRINGE | INTRAVENOUS | Status: DC | PRN
  Administered 2022-03-16: 100 mg via INTRAVENOUS

## 2022-03-16 MED ORDER — ONDANSETRON HCL 4 MG/2ML IJ SOLN
INTRAMUSCULAR | Status: DC | PRN
  Administered 2022-03-16: 4 mg via INTRAVENOUS

## 2022-03-16 MED ORDER — ONDANSETRON HCL 4 MG PO TABS: 4.0000 mg | ORAL_TABLET | Freq: Four times a day (QID) | ORAL | Status: DC | PRN

## 2022-03-16 MED ORDER — LACTATED RINGERS IV SOLN: INTRAVENOUS | Status: DC | PRN

## 2022-03-16 SURGICAL SUPPLY — 42 items
APL PRP STRL LF DISP 70% ISPRP (MISCELLANEOUS) ×2
BIT DRILL CROWE POINT TWST 4.3 (DRILL) IMPLANT
BIT DRILL LAG SCREW (DRILL) IMPLANT
BNDG CMPR 5X6 CHSV STRCH STRL (GAUZE/BANDAGES/DRESSINGS) ×1
BNDG COHESIVE 6X5 TAN ST LF (GAUZE/BANDAGES/DRESSINGS) ×1 IMPLANT
CHLORAPREP W/TINT 26 (MISCELLANEOUS) ×2 IMPLANT
DRAPE 3/4 80X56 (DRAPES) ×1 IMPLANT
DRAPE C-ARMOR (DRAPES) ×1 IMPLANT
DRILL CROWE POINT TWIST 4.3 (DRILL) ×1
DRILL LAG SCREW (DRILL) ×1
DRSG MEPILEX SACRM 8.7X9.8 (GAUZE/BANDAGES/DRESSINGS) ×1 IMPLANT
DRSG OPSITE POSTOP 3X4 (GAUZE/BANDAGES/DRESSINGS) IMPLANT
DRSG OPSITE POSTOP 4X6 (GAUZE/BANDAGES/DRESSINGS) IMPLANT
ELECT CAUTERY BLADE 6.4 (BLADE) ×1 IMPLANT
ELECT REM PT RETURN 9FT ADLT (ELECTROSURGICAL) ×1
ELECTRODE REM PT RTRN 9FT ADLT (ELECTROSURGICAL) ×1 IMPLANT
GLOVE BIO SURGEON STRL SZ8 (GLOVE) ×2 IMPLANT
GLOVE SURG UNDER LTX SZ8 (GLOVE) ×1 IMPLANT
GOWN STRL REUS W/ TWL LRG LVL3 (GOWN DISPOSABLE) ×1 IMPLANT
GOWN STRL REUS W/ TWL XL LVL3 (GOWN DISPOSABLE) ×1 IMPLANT
GOWN STRL REUS W/TWL LRG LVL3 (GOWN DISPOSABLE) ×1
GOWN STRL REUS W/TWL XL LVL3 (GOWN DISPOSABLE) ×1
GUIDEPIN VERSANAIL DSP 3.2X444 (ORTHOPEDIC DISPOSABLE SUPPLIES) IMPLANT
GUIDEWIRE BALL NOSE 100CM (WIRE) IMPLANT
HFN RH 130 DEG 9MM X 360MM (Nail) IMPLANT
MANIFOLD NEPTUNE II (INSTRUMENTS) ×1 IMPLANT
MAT ABSORB  FLUID 56X50 GRAY (MISCELLANEOUS) ×1
MAT ABSORB FLUID 56X50 GRAY (MISCELLANEOUS) ×1 IMPLANT
NEEDLE HYPO 22GX1.5 SAFETY (NEEDLE) ×1 IMPLANT
NS IRRIG 1000ML POUR BTL (IV SOLUTION) IMPLANT
PACK HIP COMPR (MISCELLANEOUS) ×1 IMPLANT
SCREW BONE CORTICAL 5.0X40 (Screw) IMPLANT
SCREW LAG HIP NAIL 10.5X95 (Screw) IMPLANT
SCREWDRIVER HEX TIP 3.5MM (MISCELLANEOUS) IMPLANT
STAPLER SKIN PROX 35W (STAPLE) ×1 IMPLANT
SUT VIC AB 0 CT1 36 (SUTURE) ×1 IMPLANT
SUT VIC AB 1 CT1 36 (SUTURE) ×1 IMPLANT
SUT VIC AB 2-0 CT1 (SUTURE) ×2 IMPLANT
SYR 10ML LL (SYRINGE) ×1 IMPLANT
SYR 30ML LL (SYRINGE) ×1 IMPLANT
TRAP FLUID SMOKE EVACUATOR (MISCELLANEOUS) ×1 IMPLANT
WATER STERILE IRR 1000ML POUR (IV SOLUTION) IMPLANT

## 2022-03-16 NOTE — Progress Notes (Signed)
PROGRESS NOTE    Angela Trevino  MHD:[id number] DOB: 10/28/1926 DOA: 03/15/2022 PCP: [place] Pink, MD    Assessment & Plan:   Principal Problem:   Closed right hip fracture [facility]) Active Problems:   Hypokalemia   Essential hypertension   Dyslipidemia  Assessment and Plan: Closed right hip fracture: secondary to mechanical fall. NPO. Morphine prn for pain. Will go for surgery today as per ortho surg. Ortho surg recs apprec  Hypokalemia: WNL today   HLD: continue on statin   HTN: continue on amlodipine      DVT prophylaxis: SCDs Code Status: full Family Communication: discussed pt's care w/ pt's family at bedside and answered their questions  Disposition Plan: depends on PT/OT resc (not consulted yet)  Level of care: Med-Surg  Status is: Inpatient Remains inpatient appropriate because: severity of illness, going for hip surgery today     Consultants:  Ortho surg   Procedures:   Antimicrobials:    Subjective: Pt c/o fatigue   Objective: Vitals:   03/15/22 2000 03/15/22 2058 03/16/22 0023 03/16/22 0414  BP: (!) 153/81 (!) 158/65 124/61 (!) 129/56  Pulse: 88 79 70 60  Resp: '15 18 18 18  '$ Temp:  99 F (37.2 C) 98.4 F (36.9 C) 98.5 F (36.9 C)  TempSrc:  Oral    SpO2: 96% 100% 100% 100%  Weight:      Height:        Intake/Output Summary (Last 24 hours) at 03/16/2022 0733 Last data filed at 03/16/2022 0650 Gross per 24 hour  Intake 1180.79 ml  Output 700 ml  Net 480.79 ml   Filed Weights   03/15/22 1913  Weight: 59.9 kg    Examination:  General exam: Appears comfortable. Frail appearing  Respiratory system: Clear to auscultation. Respiratory effort normal. Cardiovascular system: S1 & S2 +. No gallops or clicks.  Gastrointestinal system: Abdomen is nondistended, soft and nontender.  Normal bowel sounds heard. Central nervous system: Alert and awake. Moves all extremities  Psychiatry: Judgement and insight appear normal. Mood & affect  appropriate.     Data Reviewed: I have personally reviewed following labs and imaging studies  CBC: Recent Labs  Lab 03/15/22 1738  WBC 7.7  HGB 12.8  HCT 39.0  MCV 93.8  PLT 211   Basic Metabolic Panel: Recent Labs  Lab 03/15/22 1738  NA 140  K 3.2*  CL 107  CO2 24  GLUCOSE 196*  BUN 15  CREATININE 0.75  CALCIUM 9.0   GFR: Estimated Creatinine Clearance: 37.9 mL/min (by C-G formula based on SCr of 0.75 mg/dL). Liver Function Tests: Recent Labs  Lab 03/15/22 1738  AST 24  ALT 22  ALKPHOS 77  BILITOT 0.7  PROT 6.7  ALBUMIN 4.3   No results for input(s): "LIPASE", "AMYLASE" in the last 168 hours. No results for input(s): "AMMONIA" in the last 168 hours. Coagulation Profile: No results for input(s): "INR", "PROTIME" in the last 168 hours. Cardiac Enzymes: No results for input(s): "CKTOTAL", "CKMB", "CKMBINDEX", "TROPONINI" in the last 168 hours. BNP (last 3 results) No results for input(s): "PROBNP" in the last 8760 hours. HbA1C: No results for input(s): "HGBA1C" in the last 72 hours. CBG: No results for input(s): "GLUCAP" in the last 168 hours. Lipid Profile: No results for input(s): "CHOL", "HDL", "LDLCALC", "TRIG", "CHOLHDL", "LDLDIRECT" in the last 72 hours. Thyroid Function Tests: No results for input(s): "TSH", "T4TOTAL", "FREET4", "T3FREE", "THYROIDAB" in the last 72 hours. Anemia Panel: No results for input(s): "VITAMINB12", "FOLATE", "  FERRITIN", "TIBC", "IRON", "RETICCTPCT" in the last 72 hours. Sepsis Labs: No results for input(s): "PROCALCITON", "LATICACIDVEN" in the last 168 hours.  Recent Results (from the past 240 hour(s))  SARS Coronavirus 2 by RT PCR (hospital order, performed in [facility] hospital lab) *cepheid single result test* Anterior Nasal Swab     Status: None   Collection Time: 03/15/22  5:38 PM   Specimen: Anterior Nasal Swab  Result Value Ref Range Status   SARS Coronavirus 2 by RT PCR NEGATIVE NEGATIVE Final    Comment:  (NOTE) SARS-CoV-2 target nucleic acids are NOT DETECTED.  The SARS-CoV-2 RNA is generally detectable in upper and lower respiratory specimens during the acute phase of infection. The lowest concentration of SARS-CoV-2 viral copies this assay can detect is 250 copies / mL. A negative result does not preclude SARS-CoV-2 infection and should not be used as the sole basis for treatment or other patient management decisions.  A negative result may occur with improper specimen collection / handling, submission of specimen other than nasopharyngeal swab, presence of viral mutation(s) within the areas targeted by this assay, and inadequate number of viral copies (<250 copies / mL). A negative result must be combined with clinical observations, patient history, and epidemiological information.  Fact Sheet for Patients:   https://www.patel.info/  Fact Sheet for Healthcare Providers: https://hall.com/  This test is not yet approved or  cleared by the [place] FDA and has been authorized for detection and/or diagnosis of SARS-CoV-2 by FDA under an Emergency Use Authorization (EUA).  This EUA will remain in effect (meaning this test can be used) for the duration of the COVID-19 declaration under Section 564(b)(1) of the Act, 21 U.S.C. section 360bbb-3(b)(1), unless the authorization is terminated or revoked sooner.  Performed at [facility], [address] [place], [place] [address]   Surgical PCR screen     Status: None   Collection Time: 03/15/22 11:10 PM   Specimen: Nasal Mucosa; Nasal Swab  Result Value Ref Range Status   MRSA, PCR NEGATIVE NEGATIVE Final   Staphylococcus aureus NEGATIVE NEGATIVE Final    Comment: (NOTE) The Xpert SA Assay (FDA approved for NASAL specimens in patients 22 years of age and older), is one component of a comprehensive surveillance program. It is not intended to diagnose infection nor to guide or  monitor treatment. Performed at [facility], [address], [place], [place] [address]          Radiology Studies: DG Chest Portable 1 View  Result Date: 03/15/2022 CLINICAL DATA:  Preop hip fracture EXAM: PORTABLE CHEST 1 VIEW COMPARISON:  None Available. FINDINGS: The heart size and mediastinal contours are within normal limits. Both lungs are clear. The visualized skeletal structures are unremarkable. IMPRESSION: No active disease. Electronically Signed   By: Donavan Foil M.D.   On: 03/15/2022 18:29   DG HIP UNILAT W OR W/O PELVIS 2-3 VIEWS RIGHT  Result Date: 03/15/2022 CLINICAL DATA:  Hip fracture EXAM: DG HIP (WITH OR WITHOUT PELVIS) 2-3V RIGHT COMPARISON:  None Available. FINDINGS: SI joints are non widened. Pubic symphysis and rami appear intact. Acute comminuted and displaced intertrochanteric fracture with involvement of the subtrochanteric shaft of the femur. Mild apex lateral angulation. No femoral head dislocation IMPRESSION: Acute comminuted and displaced proximal femoral fracture involves the intertrochanteric region and subtrochanteric shaft Electronically Signed   By: Donavan Foil M.D.   On: 03/15/2022 18:28   MM 3D SCREEN BREAST BILATERAL  Result Date: 03/15/2022 CLINICAL DATA:  Screening. EXAM: DIGITAL SCREENING BILATERAL MAMMOGRAM WITH TOMOSYNTHESIS AND CAD TECHNIQUE: Bilateral screening digital craniocaudal and mediolateral oblique mammograms were obtained. Bilateral screening digital breast tomosynthesis was performed. The images were evaluated with computer-aided detection. COMPARISON:  Previous exam(s). ACR Breast Density Category c: The breast tissue is heterogeneously dense, which may obscure small masses. FINDINGS: There are no findings suspicious for malignancy. IMPRESSION: No mammographic evidence of malignancy. A result letter of this screening mammogram will be mailed directly to the patient. RECOMMENDATION: Screening mammogram in one year.  (Code:SM-B-01Y) BI-RADS CATEGORY  1: Negative. Electronically Signed   By: Margarette Canada M.D.   On: 03/15/2022 13:04        Scheduled Meds:  amLODipine  5 mg Oral Daily   gabapentin  200 mg Oral QHS   [START ON 03/17/2022] influenza vaccine adjuvanted  0.5 mL Intramuscular Once   ondansetron (ZOFRAN) IV  4 mg Intravenous Once   pravastatin  40 mg Oral Daily   Continuous Infusions:  sodium chloride 100 mL/hr at 03/15/22 2003    ceFAZolin (ANCEF) IV       LOS: 1 day    Time spent: 35 mins     Wyvonnia Dusky, MD [facility] Pager [phone]  If 7PM-7AM, please contact night-coverage [email or web address] 03/16/2022, 7:33 AM

## 2022-03-16 NOTE — Anesthesia Postprocedure Evaluation (Signed)
Anesthesia Post Note  Patient: Angela Trevino  Procedure(s) Performed: INTRAMEDULLARY (IM) NAIL INTERTROCHANTERIC (Right: Hip)  Patient location during evaluation: PACU Anesthesia Type: General Level of consciousness: awake and alert Pain management: pain level controlled Vital Signs Assessment: post-procedure vital signs reviewed and stable Respiratory status: spontaneous breathing, nonlabored ventilation, respiratory function stable and patient connected to nasal cannula oxygen Cardiovascular status: blood pressure returned to baseline and stable Postop Assessment: no apparent nausea or vomiting Anesthetic complications: no   No notable events documented.   Last Vitals:  Vitals:   03/16/22 1231 03/16/22 1257  BP: 139/61 137/63  Pulse: 64 63  Resp: (!) 24 17  Temp: (!) 36.1 C 36.6 C  SpO2: 96% 94%    Last Pain:  Vitals:   03/16/22 1311  TempSrc:   PainSc: 7                  Precious Haws Knolan Simien

## 2022-03-16 NOTE — Anesthesia Procedure Notes (Signed)
Procedure Name: Intubation Date/Time: 03/16/2022 10:30 AM  Performed by: Sherrine Maples, CRNAPre-anesthesia Checklist: Patient identified, Patient being monitored, Timeout performed, Emergency Drugs available and Suction available Patient Re-evaluated:Patient Re-evaluated prior to induction Oxygen Delivery Method: Circle system utilized Preoxygenation: Pre-oxygenation with 100% oxygen Induction Type: IV induction Laryngoscope Size: 3 and McGraph Grade View: Grade I Tube type: Oral Tube size: 6.5 mm Number of attempts: 1 Airway Equipment and Method: Stylet Placement Confirmation: ETT inserted through vocal cords under direct vision, positive ETCO2 and breath sounds checked- equal and bilateral Secured at: 20 cm Tube secured with: Tape Dental Injury: Teeth and Oropharynx as per pre-operative assessment

## 2022-03-16 NOTE — TOC CM/SW Note (Signed)
TOC will follow for PT/OT evaluations.  Angela Trevino, [place]

## 2022-03-16 NOTE — Anesthesia Preprocedure Evaluation (Addendum)
Anesthesia Evaluation  Patient identified by MRN, date of birth, ID band Patient awake    Reviewed: Allergy & Precautions, NPO status , Patient's Chart, lab work & pertinent test results  History of Anesthesia Complications Negative for: history of anesthetic complications  Airway Mallampati: III  TM Distance: >3 FB Neck ROM: full    Dental  (+) Chipped, Missing, Poor Dentition, Loose   Pulmonary neg pulmonary ROS, neg shortness of breath,    Pulmonary exam normal        Cardiovascular hypertension, (-) angina(-) Past MI Normal cardiovascular exam     Neuro/Psych negative neurological ROS  negative psych ROS   GI/Hepatic Neg liver ROS, GERD  Poorly Controlled,  Endo/Other  negative endocrine ROS  Renal/GU      Musculoskeletal   Abdominal   Peds  Hematology negative hematology ROS (+)   Anesthesia Other Findings Past Medical History: No date: Actinic keratosis 05/17/2019: Basal cell carcinoma     Comment:  R post neck 05/31/2019: Melanoma ([place])     Comment:  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 Excised 07/05/2019, margins free.  History reviewed. No pertinent surgical history.  BMI    Body Mass Index: 22.66 kg/m      Reproductive/Obstetrics negative OB ROS                           Anesthesia Physical Anesthesia Plan  ASA: 2  Anesthesia Plan: General ETT   Post-op Pain Management:    Induction: Intravenous  PONV Risk Score and Plan: Ondansetron, Dexamethasone, Midazolam and Treatment may vary due to age or medical condition  Airway Management Planned: Oral ETT  Additional Equipment:   Intra-op Plan:   Post-operative Plan: Extubation in OR  Informed Consent: I have reviewed the patients History and Physical, chart, labs and discussed the procedure including the risks, benefits and alternatives for the proposed anesthesia with  the patient or authorized representative who has indicated his/her understanding and acceptance.     Dental Advisory Given  Plan Discussed with: Anesthesiologist, CRNA and Surgeon  Anesthesia Plan Comments: (Patient feels nauseated and declines spinal, plan for GA  Patient and family member consented for risks of anesthesia including but not limited to:  - adverse reactions to medications - damage to eyes, teeth, lips or other oral mucosa - nerve damage due to positioning  - sore throat or hoarseness - Damage to heart, brain, nerves, lungs, other parts of body or loss of life  They voiced understanding.)       Anesthesia Quick Evaluation

## 2022-03-16 NOTE — Progress Notes (Signed)
Initial Nutrition Assessment  INTERVENTION:   -Ensure Plus High Protein po BID, each supplement provides 350 kcal and 20 grams of protein.   -Multivitamin with minerals daily  NUTRITION DIAGNOSIS:   Increased nutrient needs related to hip fracture, post-op healing as evidenced by estimated needs.  GOAL:   Patient will meet greater than or equal to 90% of their needs  MONITOR:   PO intake, Supplement acceptance, Diet advancement, Labs, Weight trends, I & O's  REASON FOR ASSESSMENT:   Consult Hip fracture protocol  ASSESSMENT:   88 y.o. female with a history of skin cancers and peripheral neuropathy, but in otherwise excellent health who lives independently and ambulates with a cane. She was brought to the emergency room where x-rays demonstrated an intertrochanteric fracture of the right hip.  Patient currently in OR for IM nail. Given increased needs from post-op healing, will order Ensure supplements and daily MVI. No reports of poor appetite PTA but pt is of advanced age.  Per weight records, no weight history available. Current weight: 132 lbs.  Medications reviewed.  Labs reviewed.  NUTRITION - FOCUSED PHYSICAL EXAM:  Unable to complete, working remote  Diet Order:   Diet Order             Diet NPO time specified  Diet effective midnight                   EDUCATION NEEDS:   No education needs have been identified at this time  Skin:  Skin Assessment: Reviewed RN Assessment  Last BM:  PTA  Height:   Ht Readings from Last 1 Encounters:  03/15/22 '5\' 4"'$  (1.626 m)    Weight:   Wt Readings from Last 1 Encounters:  03/15/22 59.9 kg    BMI:  Body mass index is 22.66 kg/m.  Estimated Nutritional Needs:   Kcal:  1500-1700  Protein:  70-85g  Fluid:  1.5L/day   Clayton Bibles, MS, RD, LDN Inpatient Clinical Dietitian Contact information available via Amion

## 2022-03-16 NOTE — Transfer of Care (Signed)
Immediate Anesthesia Transfer of Care Note  Patient: Angela Trevino  Procedure(s) Performed: INTRAMEDULLARY (IM) NAIL INTERTROCHANTERIC (Right: Hip)  Patient Location: PACU  Anesthesia Type:General  Level of Consciousness: awake, alert  and oriented  Airway & Oxygen Therapy: Patient Spontanous Breathing and Patient connected to nasal cannula oxygen  Post-op Assessment: Report given to RN and Post -op Vital signs reviewed and stable  Post vital signs: Reviewed and stable  Last Vitals:  Vitals Value Taken Time  BP 134/81 03/16/22 1209  Temp 36.7   Pulse 70 03/16/22 1211  Resp 16 03/16/22 1211  SpO2 100 % 03/16/22 1211  Vitals shown include unvalidated device data.  Last Pain:  Vitals:   03/16/22 0853  TempSrc:   PainSc: 3          Complications: No notable events documented.

## 2022-03-16 NOTE — Op Note (Signed)
03/16/2022  12:05 PM  Patient:   Angela Trevino  Pre-Op Diagnosis:   Hip FX  Post-Op Diagnosis:   Same  Procedure:   Reduction and internal fixation of displaced reverse obliquity intertrochanteric right hip fracture with Biomet Affixis TFN nail.  Surgeon:   Pascal Lux, MD  Assistant:   None  Anesthesia:   GET  Findings:   As above  Complications:   None  EBL:   50 cc  Fluids:   1000 cc crystalloid  UOP:   Indeterminant  TT:   None  Drains:   None  Closure:   Staples  Implants:   Biomet Affixis 9 x 360 mm TFN with a 95 mm lag screw and a 40 mm distal interlocking screw  Brief Clinical Note:   The patient is a 88 year old female who sustained the above-noted injury yesterday afternoon when she apparently lost her balance and fell while trying to pick up her cane. She was brought to the emergency room where x-rays demonstrated the above-noted injury. The patient has been cleared medically and presents at this time for reduction and internal fixation of the displaced intertrochanteric right hip fracture.  Procedure:   The patient was brought into the operating room and lain in the supine position. After adequate general endotracheal intubation and anesthesia were obtained, the patient was repositioned on the fracture table. The uninjured leg was placed in a flexed and abducted position while the injured lower extremity was placed in longitudinal traction. The fracture was reduced using longitudinal traction and internal rotation. The adequacy of reduction was verified fluoroscopically in AP and lateral projections and found to be near anatomic. The lateral aspects of the right hip and thigh were prepped with ChloraPrep solution before being draped sterilely. Preoperative antibiotics were administered. A timeout was performed to verify the appropriate surgical site.   The greater trochanter was identified fluoroscopically and an approximately 7-8 cm incision made about 2-3  fingerbreadths above the tip of the greater trochanter. The incision was carried down through the subcutaneous tissues to expose the gluteal fascia. This was split the length of the incision, providing access to the tip of the trochanter. Under fluoroscopic guidance, a guidewire was drilled through the tip of the trochanter into the proximal metaphysis to the level of the lesser trochanter. After verifying its position fluoroscopically in AP and lateral projections, it was overreamed with the initial reamer to the depth of the lesser trochanter. A guidewire was passed down through the femoral canal to the supracondylar region. The adequacy of guidewire position was verified fluoroscopically in AP and lateral projections before the length of the guidewire within the canal was measured and found to be 375 mm. Therefore, a 360 mm length nail was selected. The guidewire was overreamed sequentially using the flexible reamers, beginning with a 9.5 mm reamer and progressing to a 10.5 mm reamer. This provided good cortical chatter. The 9 x 360 mm Biomet Affixis TFN rod was selected and advanced to the appropriate depth, as verified fluoroscopically.   The guide system for the lag screw was positioned and advanced through an approximately 2 cm stab incision over the lateral aspect of the proximal femur. The guidewire was drilled up through the trochanteric femoral nail and into the femoral neck to rest within 5 mm of subchondral bone. After verifying its position in the femoral neck and head in both AP and lateral projections, the guidewire was measured and found to be optimally replicated by a 95 mm  lag screw. The guidewire was overreamed to the appropriate depth before the lag screw was inserted and advanced to the appropriate depth as verified fluoroscopically in AP and lateral projections. The locking screw was advanced, then backed off a quarter turn to set the lag screw. Again the adequacy of hardware position and  fracture reduction was verified fluoroscopically in AP and lateral projections and found to be excellent.  Attention was directed distally. Using the "perfect circle" technique, the leg and fluoroscopy machine were positioned appropriately. An approximately 1.5 cm stab incision was made over the skin at the appropriate point before the drill bit was advanced through the cortex and across the static hole of the nail. The appropriate length of the screw was determined before the 40 mm distal interlocking screw was positioned, then advanced and tightened securely. Again the adequacy of screw position was verified fluoroscopically in AP and lateral projections and found to be excellent.  The wounds were irrigated thoroughly with sterile saline solution before the abductor fascia was reapproximated using #1 Vicryl interrupted sutures. The subcutaneous tissues were closed using 2-0 Vicryl interrupted sutures. The skin was closed using staples. A total of 30 cc of 0.5% Sensorcaine with epinephrine was injected in and around all incisions. Sterile occlusive dressings were applied to all wounds before the patient was transferred back to her hospital bed. The patient was then awakened, extubated, and returned to the recovery room in satisfactory condition after tolerating the procedure well.

## 2022-03-17 DIAGNOSIS — I10 Essential (primary) hypertension: Secondary | ICD-10-CM | POA: Diagnosis not present

## 2022-03-17 DIAGNOSIS — D649 Anemia, unspecified: Secondary | ICD-10-CM

## 2022-03-17 DIAGNOSIS — S72001A Fracture of unspecified part of neck of right femur, initial encounter for closed fracture: Secondary | ICD-10-CM | POA: Diagnosis not present

## 2022-03-17 LAB — CBC
HCT: 27.4 % — ABNORMAL LOW (ref 36.0–46.0)
Hemoglobin: 9.1 g/dL — ABNORMAL LOW (ref 12.0–15.0)
MCH: 31.1 pg (ref 26.0–34.0)
MCHC: 33.2 g/dL (ref 30.0–36.0)
MCV: 93.5 fL (ref 80.0–100.0)
Platelets: 136 10*3/uL — ABNORMAL LOW (ref 150–400)
RBC: 2.93 MIL/uL — ABNORMAL LOW (ref 3.87–5.11)
RDW: 13 % (ref 11.5–15.5)
WBC: 6.7 10*3/uL (ref 4.0–10.5)
nRBC: 0 % (ref 0.0–0.2)

## 2022-03-17 LAB — BASIC METABOLIC PANEL
Anion gap: 4 — ABNORMAL LOW (ref 5–15)
BUN: 13 mg/dL (ref 8–23)
CO2: 28 mmol/L (ref 22–32)
Calcium: 8.7 mg/dL — ABNORMAL LOW (ref 8.9–10.3)
Chloride: 111 mmol/L (ref 98–111)
Creatinine, Ser: 0.7 mg/dL (ref 0.44–1.00)
GFR, Estimated: >60 mL/min (ref >60)
Glucose, Bld: 133 mg/dL — ABNORMAL HIGH (ref 70–99)
Potassium: 4.2 mmol/L (ref 3.5–5.1)
Sodium: 143 mmol/L (ref 135–145)

## 2022-03-17 NOTE — Evaluation (Signed)
Physical Therapy Evaluation Patient Details Name: Angela Trevino MRN: [id number] DOB: 1927/06/21 Today's Date: 03/17/2022  History of Present Illness  Patient is a 88 year old female with closed right hip fracture s/p right IM nail. HIstory of history of skin cancers and peripheral neuropathy.   Clinical Impression  Patient is agreeable to PT evaluation. She was sitting up in the recliner chair. She reports she lives alone, drives short distances, and ambulates with a cane at baseline. She reports feeling dizzy and bending down to get her cane when falling at home.  Today, the patient is not at her baseline level of functional mobility. Max A required for standing from the recliner chair. Gait training initiated with moderate assistance required for ambulating a short distance in the room with rolling walker. Standing tolerance limited by pain, mild dizziness and mild nausea with activity. Recommend to continue PT to maximize independence and facilitate return to prior level of function. SNF recommended at discharge.      Recommendations for follow up therapy are one component of a multi-disciplinary discharge planning process, led by the attending physician.  Recommendations may be updated based on patient status, additional functional criteria and insurance authorization.  Follow Up Recommendations Skilled nursing-short term rehab (<3 hours/day) Can patient physically be transported by private vehicle: No    Assistance Recommended at Discharge Frequent or constant Supervision/Assistance  Patient can return home with the following  A lot of help with walking and/or transfers;A lot of help with bathing/dressing/bathroom;Help with stairs or ramp for entrance;Assist for transportation;Assistance with cooking/housework    Equipment Recommendations None recommended by PT  Recommendations for Other Services       Functional Status Assessment Patient has had a recent decline in their  functional status and demonstrates the ability to make significant improvements in function in a reasonable and predictable amount of time.     Precautions / Restrictions Precautions Precautions: Fall Restrictions Weight Bearing Restrictions: Yes RLE Weight Bearing: Weight bearing as tolerated      Mobility  Bed Mobility               General bed mobility comments: not assessed as patient sitting up on arrival and post session    Transfers Overall transfer level: Needs assistance Equipment used: Rolling walker (2 wheels) Transfers: Sit to/from Stand Sit to Stand: Max assist           General transfer comment: lifting and lowering assistance provided. verbal cues for hand placement for safety    Ambulation/Gait Ambulation/Gait assistance: Mod assist Gait Distance (Feet): 6 Feet Assistive device: Rolling walker (2 wheels) Gait Pattern/deviations: Step-to pattern, Decreased stride length, Decreased stance time - right Gait velocity: decreased     General Gait Details: weight shifting assistance provided for advancement of RLE. maximal cues for sequencing and technique using rolling walker to off load RLE for ease of ambulation. activity tolerance limited by pain, dizziness, and mild nausea  Stairs            Wheelchair Mobility    Modified Rankin (Stroke Patients Only)       Balance Overall balance assessment: Needs assistance Sitting-balance support: Feet supported Sitting balance-Leahy Scale: Fair     Standing balance support: Bilateral upper extremity supported Standing balance-Leahy Scale: Poor Standing balance comment: external support required                             Pertinent Vitals/Pain Pain Assessment  Pain Assessment: 0-10 Pain Score: 4  Pain Location: R hip Pain Descriptors / Indicators: Discomfort Pain Intervention(s): Limited activity within patient's tolerance, Monitored during session, Ice applied    Home  Living Family/patient expects to be discharged to:: Private residence Living Arrangements: Alone Available Help at Discharge: Family Type of Home: House Home Access: Stairs to enter Entrance Stairs-Rails: Psychiatric nurse of Steps: 4   Home Layout: One level Home Equipment: Conservation officer, nature (2 wheels);Cane - single point      Prior Function Prior Level of Function : Independent/Modified Independent;Driving             Mobility Comments: Mod I with cane ADLs Comments: driving short distances, independent with ADLs     Hand Dominance        Extremity/Trunk Assessment   Upper Extremity Assessment Upper Extremity Assessment: Overall WFL for tasks assessed    Lower Extremity Assessment Lower Extremity Assessment: RLE deficits/detail (LLE WNL) RLE Deficits / Details: pain with AROM of hip. can activate hip/knee/ankle movement with no knee buckling with weight bearing RLE Sensation: WNL       Communication   Communication: No difficulties  Cognition Arousal/Alertness: Awake/alert Behavior During Therapy: WFL for tasks assessed/performed Overall Cognitive Status: Within Functional Limits for tasks assessed                                 General Comments: patient able to follow all commands without difficulty. cooperative throughout session        General Comments      Exercises General Exercises - Lower Extremity Ankle Circles/Pumps: AROM, Strengthening, Both, 5 reps, Seated   Assessment/Plan    PT Assessment Patient needs continued PT services  PT Problem List Decreased strength;Decreased range of motion;Decreased activity tolerance;Decreased balance;Decreased mobility;Decreased safety awareness;Decreased knowledge of precautions;Decreased knowledge of use of DME;Pain       PT Treatment Interventions DME instruction;Gait training;Stair training;Functional mobility training;Therapeutic activities;Therapeutic exercise;Balance  training;Neuromuscular re-education;Patient/family education    PT Goals (Current goals can be found in the Care Plan section)  Acute Rehab PT Goals Patient Stated Goal: to return home if possible PT Goal Formulation: With patient Time For Goal Achievement: 03/31/22 Potential to Achieve Goals: Fair    Frequency 7X/week     Co-evaluation               AM-PAC PT "6 Clicks" Mobility  Outcome Measure Help needed turning from your back to your side while in a flat bed without using bedrails?: A Lot Help needed moving from lying on your back to sitting on the side of a flat bed without using bedrails?: A Lot Help needed moving to and from a bed to a chair (including a wheelchair)?: A Lot Help needed standing up from a chair using your arms (e.g., wheelchair or bedside chair)?: A Lot Help needed to walk in hospital room?: A Lot Help needed climbing 3-5 steps with a railing? : Total 6 Click Score: 11    End of Session Equipment Utilized During Treatment: Gait belt Activity Tolerance: Patient limited by fatigue Patient left: in chair;with call bell/phone within reach;with chair alarm set;with family/visitor present (son present at end of session) Nurse Communication: Mobility status PT Visit Diagnosis: Unsteadiness on feet (R26.81);Muscle weakness (generalized) (M62.81);Pain Pain - Right/Left: Right Pain - part of body: Hip    Time: 5409-8119 PT Time Calculation (min) (ACUTE ONLY): 23 min   Charges:   PT  Evaluation $PT Eval Low Complexity: 1 Low PT Treatments $Gait Training: 8-22 mins        Minna Merritts, PT, MPT   Percell Locus 03/17/2022, 12:51 PM

## 2022-03-17 NOTE — TOC Initial Note (Signed)
Transition of Care [facility]) - Initial/Assessment Note    Patient Details  Name: Angela Trevino MRN: [id number] Date of Birth: [birth year]  Transition of Care [facility]) CM/SW Contact:    Magnus Ivan, LCSW Phone Number: 03/17/2022, 11:14 AM  Clinical Narrative:                Met with patient and family members at bedside. Patient lives alone. Her sons live nearby. Family is supportive.  Patient drives short distances, and family also provides transport if needed. PCP is Dr. Kary Kos. Uses mail order pharmacy.  Patient has a walker, cane, and walking stick. No SNF or HH history. Patient and family are agreeable to SNF, prefer [facility]. Patient's family member has reached out to [facility] about availability as she used to work there. CSW explained it is based on availability which they understand.  CSW started SNF work up. Reached out to Seth Bake at [facility] who agreed to review referral.    Expected Discharge Plan: Skilled Nursing Facility Barriers to Discharge: Continued Medical Work up   Patient Goals and CMS Choice Patient states their goals for this hospitalization and ongoing recovery are:: short term rehab CMS Medicare.gov Compare Post Acute Care list provided to:: Patient Choice offered to / list presented to : Patient, Adult Children  Expected Discharge Plan and Services Expected Discharge Plan: [place] arrangements for the past 2 months: Single Family Home                                      Prior Living Arrangements/Services Living arrangements for the past 2 months: Single Family Home Lives with:: Self Patient language and need for interpreter reviewed:: Yes Do you feel safe going back to the place where you live?: Yes      Need for Family Participation in Patient Care: Yes (Comment) Care giver support system in place?: Yes (comment) Current home services: DME Criminal Activity/Legal Involvement Pertinent to Current  Situation/Hospitalization: No - Comment as needed  Activities of Daily Living Home Assistive Devices/Equipment: Cane (specify quad or straight), Eyeglasses, Hearing aid ADL Screening (condition at time of admission) Patient's cognitive ability adequate to safely complete daily activities?: Yes Is the patient deaf or have difficulty hearing?: Yes Does the patient have difficulty seeing, even when wearing glasses/contacts?: No Does the patient have difficulty concentrating, remembering, or making decisions?: No Patient able to express need for assistance with ADLs?: Yes Does the patient have difficulty dressing or bathing?: Yes Independently performs ADLs?: No Does the patient have difficulty walking or climbing stairs?: Yes Weakness of Legs: Right Weakness of Arms/Hands: None  Permission Sought/Granted Permission sought to share information with : Facility Sport and exercise psychologist, Family Supports Permission granted to share information with : Yes, Verbal Permission Granted     Permission granted to share info w AGENCY: SNFs  Permission granted to share info w Relationship: sons     Emotional Assessment       Orientation: : Oriented to Self, Oriented to Place, Oriented to  Time, Oriented to Situation Alcohol / Substance Use: Not Applicable Psych Involvement: No (comment)  Admission diagnosis:  Closed right hip fracture ([place]) [S72.001A] Patient Active Problem List   Diagnosis Date Noted   Closed right hip fracture ([place]) 03/15/2022   Hypokalemia 03/15/2022   Essential hypertension 03/15/2022   Dyslipidemia 03/15/2022   PCP:  Kary Kos,  Jeneen Rinks, MD Pharmacy:   [id number] - , [place] - [address] [place] [address] Phone: [phone] Fax: [phone]     Social Determinants of Health (SDOH) Interventions    Readmission Risk Interventions     No data to display

## 2022-03-17 NOTE — Plan of Care (Signed)
  Problem: Education: Goal: Knowledge of General Education information will improve Description: Including pain rating scale, medication(s)/side effects and non-pharmacologic comfort measures Outcome: Progressing   Problem: Health Behavior/Discharge Planning: Goal: Ability to manage health-related needs will improve Outcome: Progressing   Problem: Clinical Measurements: Goal: Ability to maintain clinical measurements within normal limits will improve Outcome: Progressing Goal: Will remain free from infection Outcome: Progressing Goal: Diagnostic test results will improve Outcome: Progressing Goal: Respiratory complications will improve Outcome: Progressing Goal: Cardiovascular complication will be avoided Outcome: Progressing   Problem: Activity: Goal: Risk for activity intolerance will decrease Outcome: Progressing   Problem: Nutrition: Goal: Adequate nutrition will be maintained Outcome: Progressing   Problem: Pain Managment: Goal: General experience of comfort will improve Outcome: Progressing   

## 2022-03-17 NOTE — NC FL2 (Addendum)
[place] LEVEL OF CARE SCREENING TOOL     IDENTIFICATION  Patient Name: Angela Trevino Birthdate: 30-Jul-1926 Sex: female Admission Date (Current Location): 03/15/2022  [facility] and [place] Number:  Engineering geologist and Address:  [facility], [address] [place], [place] [address]      Provider Number: [id number]  Attending Physician Name and Address:  Wyvonnia Dusky, MD  Relative Name and Phone Number:  Sharisse, Rantz)   [phone] [facility])    Current Level of Care: Hospital Recommended Level of Care: [place] Prior Approval Number:    Date Approved/Denied:   PASRR Number: [id number]  Discharge Plan:      Current Diagnoses: Patient Active Problem List   Diagnosis Date Noted   Closed right hip fracture ([place]) 03/15/2022   Hypokalemia 03/15/2022   Essential hypertension 03/15/2022   Dyslipidemia 03/15/2022    Orientation RESPIRATION BLADDER Height & Weight     Self, Situation, Place, Time  Normal External catheter Weight: 132 lb (59.9 kg) Height:  '5\' 4"'$  (162.6 cm)  BEHAVIORAL SYMPTOMS/MOOD NEUROLOGICAL BOWEL NUTRITION STATUS        Diet (regular)  AMBULATORY STATUS COMMUNICATION OF NEEDS Skin   Limited Assist Verbally Surgical wounds (R hip)                       Personal Care Assistance Level of Assistance  Bathing, Feeding, Dressing Bathing assistance: Maximum assistance Feeding assistance: Limited assistance Dressing assistance: Maximum assistance.     Functional Limitations Info             SPECIAL CARE FACTORS FREQUENCY  PT (By licensed PT), OT (By licensed OT)     PT Frequency: 5 times per week OT Frequency: 5 times per week            Contractures      Additional Factors Info  Code Status, Allergies Code Status Info: full Allergies Info: Lovastatin, Rosuvastatin, Simvastatin           Current Medications (03/17/2022):  This is the current hospital  active medication list Current Facility-Administered Medications  Medication Dose Route Frequency Provider Last Rate Last Admin   acetaminophen (TYLENOL) tablet 325-650 mg  325-650 mg Oral Q6H PRN Poggi, Marshall Cork, MD       amLODipine (NORVASC) tablet 5 mg  5 mg Oral Daily Poggi, Marshall Cork, MD   5 mg at 03/17/22 0569   bisacodyl (DULCOLAX) suppository 10 mg  10 mg Rectal Daily PRN Poggi, Marshall Cork, MD       ceFAZolin (ANCEF) IVPB 2g/100 mL premix  2 g Intravenous 30 min Pre-Op Poggi, Marshall Cork, MD       diphenhydrAMINE (BENADRYL) 12.5 MG/5ML elixir 12.5-25 mg  12.5-25 mg Oral Q4H PRN Poggi, Marshall Cork, MD       docusate sodium (COLACE) capsule 100 mg  100 mg Oral BID Poggi, Marshall Cork, MD   100 mg at 03/17/22 0952   enoxaparin (LOVENOX) injection 40 mg  40 mg Subcutaneous Q24H Poggi, Marshall Cork, MD   40 mg at 03/17/22 0745   feeding supplement (ENSURE ENLIVE / ENSURE PLUS) liquid 237 mL  237 mL Oral BID BM Wyvonnia Dusky, MD   237 mL at 03/17/22 1027   gabapentin (NEURONTIN) capsule 200 mg  200 mg Oral QHS Poggi, Marshall Cork, MD   200 mg at 03/16/22 2141   HYDROcodone-acetaminophen (NORCO/VICODIN) 5-325 MG per tablet 1-2 tablet  1-2  tablet Oral Q6H PRN Poggi, Marshall Cork, MD   1 tablet at 03/16/22 1834   magnesium hydroxide (MILK OF MAGNESIA) suspension 30 mL  30 mL Oral Daily PRN Poggi, Marshall Cork, MD       metoCLOPramide (REGLAN) tablet 5-10 mg  5-10 mg Oral Q8H PRN Poggi, Marshall Cork, MD       Or   metoCLOPramide (REGLAN) injection 5-10 mg  5-10 mg Intravenous Q8H PRN Poggi, Marshall Cork, MD       morphine (PF) 2 MG/ML injection 0.5 mg  0.5 mg Intravenous Q2H PRN Poggi, Marshall Cork, MD   0.5 mg at 03/16/22 1311   multivitamin with minerals tablet 1 tablet  1 tablet Oral Daily Wyvonnia Dusky, MD   1 tablet at 03/17/22 0952   ondansetron (ZOFRAN) injection 4 mg  4 mg Intravenous Once Harvest Dark, MD       ondansetron [facility]) tablet 4 mg  4 mg Oral Q6H PRN Poggi, Marshall Cork, MD       Or   ondansetron (ZOFRAN) injection 4 mg  4 mg  Intravenous Q6H PRN Poggi, Marshall Cork, MD       pravastatin (PRAVACHOL) tablet 40 mg  40 mg Oral Daily Poggi, Marshall Cork, MD   40 mg at 03/17/22 6503   sodium phosphate (FLEET) 7-19 GM/118ML enema 1 enema  1 enema Rectal Once PRN Poggi, Marshall Cork, MD       traZODone (DESYREL) tablet 25 mg  25 mg Oral QHS PRN Poggi, Marshall Cork, MD         Discharge Medications: Please see discharge summary for a list of discharge medications.  Relevant Imaging Results:  Relevant Lab Results:   Additional Information SS #: [id number]  [place], LCSW

## 2022-03-17 NOTE — Plan of Care (Signed)
  Problem: Clinical Measurements: Goal: Ability to maintain clinical measurements within normal limits will improve Outcome: Progressing   Problem: Clinical Measurements: Goal: Will remain free from infection Outcome: Progressing   Problem: Clinical Measurements: Goal: Cardiovascular complication will be avoided Outcome: Progressing   Problem: Coping: Goal: Level of anxiety will decrease Outcome: Progressing   Problem: Safety: Goal: Ability to remain free from injury will improve Outcome: Progressing   Problem: Health Behavior/Discharge Planning: Goal: Ability to manage health-related needs will improve Outcome: Progressing   Problem: Clinical Measurements: Goal: Ability to maintain clinical measurements within normal limits will improve Outcome: Progressing

## 2022-03-17 NOTE — Progress Notes (Signed)
PROGRESS NOTE    Angela Trevino  VCB:[id number] DOB: [birth year] DOA: 03/15/2022 PCP: [place] Pink, MD    Assessment & Plan:   Principal Problem:   Closed right hip fracture [facility]) Active Problems:   Hypokalemia   Essential hypertension   Dyslipidemia  Assessment and Plan: Closed right hip fracture: secondary to mechanical fall. S/p reduction & internal fixation of displaced reverse obliquity intertrochanteric right hip fracture on 03/16/22 as per ortho surg. Ortho surg recs apprec. PT/OT recs SNF. CM is working on placement   Normocytic anemia: w/ component of acute blood loss anemia secondary to recent surgery. No need for a transfusion currently   Hypokalemia: WNL today   HLD: continue on statin   HTN: continue on CCB  Thrombocytopenia: etiology unclear. Will continue to monitor    DVT prophylaxis: lovenox  Code Status: full Family Communication: discussed pt's care w/ pt's son, Gerald Stabs, and answered his questions Disposition Plan: d/c to SNF, CM is working on placement   Level of care: Med-Surg  Status is: Inpatient Remains inpatient appropriate because: needs SNF placement     Consultants:  Ortho surg   Procedures:   Antimicrobials:    Subjective: Pt c/o intermittent hip pain   Objective: Vitals:   03/16/22 1257 03/16/22 1544 03/16/22 2359 03/17/22 0503  BP: 137/63 126/61 (!) 130/48 (!) 149/74  Pulse: 63 67 75 78  Resp: '17 16 17 17  '$ Temp: 97.9 F (36.6 C) 98.2 F (36.8 C) 98.6 F (37 C) 98.2 F (36.8 C)  TempSrc:   Oral   SpO2: 94% 98% 96% 93%  Weight:      Height:        Intake/Output Summary (Last 24 hours) at 03/17/2022 0741 Last data filed at 03/17/2022 3846 Gross per 24 hour  Intake 1254.9 ml  Output 1500 ml  Net -245.1 ml   Filed Weights   03/15/22 1913  Weight: 59.9 kg    Examination:  General exam: Appears calm & comfortable. Frail appearing  Respiratory system: clear breath sounds b/l  Cardiovascular system: S1/S2+. No  rubs or clicks  Gastrointestinal system: Abd is soft, NT, ND & normal bowel sounds  Central nervous system: alert and oriented. Moves all extremities  Psychiatry: Judgement and insight appears normal. Appropriate mood and affect.     Data Reviewed: I have personally reviewed following labs and imaging studies  CBC: Recent Labs  Lab 03/15/22 1738 03/16/22 0751 03/17/22 0719  WBC 7.7 7.1 6.7  HGB 12.8 10.7* 9.1*  HCT 39.0 32.5* 27.4*  MCV 93.8 95.6 93.5  PLT 211 155 659*   Basic Metabolic Panel: Recent Labs  Lab 03/15/22 1738 03/16/22 0751  NA 140 137  K 3.2* 4.1  CL 107 104  CO2 24 26  GLUCOSE 196* 131*  BUN 15 11  CREATININE 0.75 0.62  CALCIUM 9.0 8.2*   GFR: Estimated Creatinine Clearance: 37.9 mL/min (by C-G formula based on SCr of 0.62 mg/dL). Liver Function Tests: Recent Labs  Lab 03/15/22 1738  AST 24  ALT 22  ALKPHOS 77  BILITOT 0.7  PROT 6.7  ALBUMIN 4.3   No results for input(s): "LIPASE", "AMYLASE" in the last 168 hours. No results for input(s): "AMMONIA" in the last 168 hours. Coagulation Profile: No results for input(s): "INR", "PROTIME" in the last 168 hours. Cardiac Enzymes: No results for input(s): "CKTOTAL", "CKMB", "CKMBINDEX", "TROPONINI" in the last 168 hours. BNP (last 3 results) No results for input(s): "PROBNP" in the last 8760 hours. HbA1C: No  results for input(s): "HGBA1C" in the last 72 hours. CBG: No results for input(s): "GLUCAP" in the last 168 hours. Lipid Profile: No results for input(s): "CHOL", "HDL", "LDLCALC", "TRIG", "CHOLHDL", "LDLDIRECT" in the last 72 hours. Thyroid Function Tests: No results for input(s): "TSH", "T4TOTAL", "FREET4", "T3FREE", "THYROIDAB" in the last 72 hours. Anemia Panel: No results for input(s): "VITAMINB12", "FOLATE", "FERRITIN", "TIBC", "IRON", "RETICCTPCT" in the last 72 hours. Sepsis Labs: No results for input(s): "PROCALCITON", "LATICACIDVEN" in the last 168 hours.  Recent Results (from  the past 240 hour(s))  SARS Coronavirus 2 by RT PCR (hospital order, performed in [facility] hospital lab) *cepheid single result test* Anterior Nasal Swab     Status: None   Collection Time: 03/15/22  5:38 PM   Specimen: Anterior Nasal Swab  Result Value Ref Range Status   SARS Coronavirus 2 by RT PCR NEGATIVE NEGATIVE Final    Comment: (NOTE) SARS-CoV-2 target nucleic acids are NOT DETECTED.  The SARS-CoV-2 RNA is generally detectable in upper and lower respiratory specimens during the acute phase of infection. The lowest concentration of SARS-CoV-2 viral copies this assay can detect is 250 copies / mL. A negative result does not preclude SARS-CoV-2 infection and should not be used as the sole basis for treatment or other patient management decisions.  A negative result may occur with improper specimen collection / handling, submission of specimen other than nasopharyngeal swab, presence of viral mutation(s) within the areas targeted by this assay, and inadequate number of viral copies (<250 copies / mL). A negative result must be combined with clinical observations, patient history, and epidemiological information.  Fact Sheet for Patients:   https://www.patel.info/  Fact Sheet for Healthcare Providers: https://hall.com/  This test is not yet approved or  cleared by the [place] FDA and has been authorized for detection and/or diagnosis of SARS-CoV-2 by FDA under an Emergency Use Authorization (EUA).  This EUA will remain in effect (meaning this test can be used) for the duration of the COVID-19 declaration under Section 564(b)(1) of the Act, 21 U.S.C. section 360bbb-3(b)(1), unless the authorization is terminated or revoked sooner.  Performed at [facility], [address], [place], [place] [address]   Surgical PCR screen     Status: None   Collection Time: 03/15/22 11:10 PM   Specimen: Nasal Mucosa; Nasal Swab   Result Value Ref Range Status   MRSA, PCR NEGATIVE NEGATIVE Final   Staphylococcus aureus NEGATIVE NEGATIVE Final    Comment: (NOTE) The Xpert SA Assay (FDA approved for NASAL specimens in patients 19 years of age and older), is one component of a comprehensive surveillance program. It is not intended to diagnose infection nor to guide or monitor treatment. Performed at [facility], [address] [place], [place] [address]          Radiology Studies: DG HIP UNILAT WITH PELVIS 2-3 VIEWS RIGHT  Result Date: 03/16/2022 CLINICAL DATA:  Fluoroscopic assistance for internal fixation of fracture of right femur EXAM: DG HIP (WITH OR WITHOUT PELVIS) 2-3V RIGHT COMPARISON:  03/15/2022 FINDINGS: Fluoroscopic images show reduction and internal fixation of comminuted intertrochanteric fracture extending to the proximal shaft with intramedullary rod. Fluoroscopy time 54 seconds. Radiation dose 3.03 mGy. IMPRESSION: Fluoroscopic assistance was provided for reduction and internal fixation of comminuted fracture of proximal right femur. Electronically Signed   By: Elmer Picker M.D.   On: 03/16/2022 11:47   DG C-Arm 1-60 Min-No Report  Result Date: 03/16/2022 Fluoroscopy was utilized by the  requesting physician.  No radiographic interpretation.   DG Chest Portable 1 View  Result Date: 03/15/2022 CLINICAL DATA:  Preop hip fracture EXAM: PORTABLE CHEST 1 VIEW COMPARISON:  None Available. FINDINGS: The heart size and mediastinal contours are within normal limits. Both lungs are clear. The visualized skeletal structures are unremarkable. IMPRESSION: No active disease. Electronically Signed   By: Donavan Foil M.D.   On: 03/15/2022 18:29   DG HIP UNILAT W OR W/O PELVIS 2-3 VIEWS RIGHT  Result Date: 03/15/2022 CLINICAL DATA:  Hip fracture EXAM: DG HIP (WITH OR WITHOUT PELVIS) 2-3V RIGHT COMPARISON:  None Available. FINDINGS: SI joints are non widened. Pubic symphysis and rami appear intact.  Acute comminuted and displaced intertrochanteric fracture with involvement of the subtrochanteric shaft of the femur. Mild apex lateral angulation. No femoral head dislocation IMPRESSION: Acute comminuted and displaced proximal femoral fracture involves the intertrochanteric region and subtrochanteric shaft Electronically Signed   By: Donavan Foil M.D.   On: 03/15/2022 18:28        Scheduled Meds:  acetaminophen  500 mg Oral Q6H   amLODipine  5 mg Oral Daily   docusate sodium  100 mg Oral BID   enoxaparin (LOVENOX) injection  40 mg Subcutaneous Q24H   feeding supplement  237 mL Oral BID BM   gabapentin  200 mg Oral QHS   influenza vaccine adjuvanted  0.5 mL Intramuscular Once   multivitamin with minerals  1 tablet Oral Daily   ondansetron (ZOFRAN) IV  4 mg Intravenous Once   pravastatin  40 mg Oral Daily   Continuous Infusions:  sodium chloride 75 mL/hr at 03/17/22 0311    ceFAZolin (ANCEF) IV       LOS: 2 days    Time spent: 35 mins     Wyvonnia Dusky, MD [facility] Pager [phone]  If 7PM-7AM, please contact night-coverage [email or web address] 03/17/2022, 7:41 AM

## 2022-03-17 NOTE — Progress Notes (Signed)
  Subjective: 1 Day Post-Op Procedure(s) (LRB): INTRAMEDULLARY (IM) NAIL INTERTROCHANTERIC (Right) Patient reports pain as mild.   Patient is well, and has had no acute complaints or problems Plan is to go home versus Rehab after hospital stay. Negative for chest pain and shortness of breath Fever: no Gastrointestinal: Negative for nausea and vomiting  Objective: Vital signs in last 24 hours: Temp:  [97 F (36.1 C)-99 F (37.2 C)] 98.2 F (36.8 C) (09/10 0503) Pulse Rate:  [63-78] 78 (09/10 0503) Resp:  [14-24] 17 (09/10 0503) BP: (126-149)/(48-81) 149/74 (09/10 0503) SpO2:  [93 %-100 %] 93 % (09/10 0503)  Intake/Output from previous day:  Intake/Output Summary (Last 24 hours) at 03/17/2022 0628 Last data filed at 03/17/2022 1103 Gross per 24 hour  Intake 1254.9 ml  Output 2200 ml  Net -945.1 ml    Intake/Output this shift: Total I/O In: 1068.3 [I.V.:894.8; IV Piggyback:173.4] Out: 700 [Urine:700]  Labs: Recent Labs    03/15/22 1738 03/16/22 0751  HGB 12.8 10.7*   Recent Labs    03/15/22 1738 03/16/22 0751  WBC 7.7 7.1  RBC 4.16 3.40*  HCT 39.0 32.5*  PLT 211 155   Recent Labs    03/15/22 1738 03/16/22 0751  NA 140 137  K 3.2* 4.1  CL 107 104  CO2 24 26  BUN 15 11  CREATININE 0.75 0.62  GLUCOSE 196* 131*  CALCIUM 9.0 8.2*   No results for input(s): "LABPT", "INR" in the last 72 hours.   EXAM General - Patient is Alert and Oriented Extremity - Neurovascular intact Sensation intact distally Dorsiflexion/Plantar flexion intact Compartment soft Dressing/Incision - clean, dry, no drainage Motor Function - intact, moving foot and toes well on exam.   Past Medical History:  Diagnosis Date   Actinic keratosis    Basal cell carcinoma 05/17/2019   R post neck   Melanoma ([place]) 05/31/2019   left lat knee BRESLOW'S DEPTH/MAXIMUM TUMOR THICKNESS: 0.7 MM. Excised 07/05/2019, margins free.    Assessment/Plan: 1 Day Post-Op Procedure(s)  (LRB): INTRAMEDULLARY (IM) NAIL INTERTROCHANTERIC (Right) Principal Problem:   Closed right hip fracture (HCC) Active Problems:   Hypokalemia   Essential hypertension   Dyslipidemia  Estimated body mass index is 22.66 kg/m as calculated from the following:   Height as of this encounter: '5\' 4"'$  (1.626 m).   Weight as of this encounter: 59.9 kg. Advance diet Up with therapy  DVT Prophylaxis - Lovenox, Foot Pumps, and TED hose Weight-Bearing as tolerated to left leg  Reche Dixon, PA-C Orthopaedic Surgery 03/17/2022, 6:28 AM

## 2022-03-18 ENCOUNTER — Encounter: Payer: Self-pay | Admitting: Surgery

## 2022-03-18 DIAGNOSIS — D696 Thrombocytopenia, unspecified: Secondary | ICD-10-CM

## 2022-03-18 DIAGNOSIS — R42 Dizziness and giddiness: Secondary | ICD-10-CM | POA: Diagnosis not present

## 2022-03-18 DIAGNOSIS — S72001A Fracture of unspecified part of neck of right femur, initial encounter for closed fracture: Secondary | ICD-10-CM | POA: Diagnosis not present

## 2022-03-18 LAB — CBC
HCT: 26.9 % — ABNORMAL LOW (ref 36.0–46.0)
Hemoglobin: 8.8 g/dL — ABNORMAL LOW (ref 12.0–15.0)
MCH: 30.8 pg (ref 26.0–34.0)
MCHC: 32.7 g/dL (ref 30.0–36.0)
MCV: 94.1 fL (ref 80.0–100.0)
Platelets: 135 10*3/uL — ABNORMAL LOW (ref 150–400)
RBC: 2.86 MIL/uL — ABNORMAL LOW (ref 3.87–5.11)
RDW: 13.4 % (ref 11.5–15.5)
WBC: 5.8 10*3/uL (ref 4.0–10.5)
nRBC: 0 % (ref 0.0–0.2)

## 2022-03-18 LAB — BASIC METABOLIC PANEL
Anion gap: 5 (ref 5–15)
BUN: 17 mg/dL (ref 8–23)
CO2: 28 mmol/L (ref 22–32)
Calcium: 8.3 mg/dL — ABNORMAL LOW (ref 8.9–10.3)
Chloride: 108 mmol/L (ref 98–111)
Creatinine, Ser: 0.65 mg/dL (ref 0.44–1.00)
GFR, Estimated: >60 mL/min (ref >60)
Glucose, Bld: 128 mg/dL — ABNORMAL HIGH (ref 70–99)
Potassium: 3.9 mmol/L (ref 3.5–5.1)
Sodium: 141 mmol/L (ref 135–145)

## 2022-03-18 MED ORDER — MECLIZINE HCL 25 MG PO TABS: 12.5000 mg | ORAL_TABLET | Freq: Three times a day (TID) | ORAL | Status: DC | PRN

## 2022-03-18 NOTE — Plan of Care (Signed)

## 2022-03-18 NOTE — Care Management Important Message (Signed)
Important Message  Patient Details  Name: Angela Trevino MRN: [id number] Date of Birth: 09-16-1926   Medicare Important Message Given:  Yes     Loann Quill 03/18/2022, 3:39 PM

## 2022-03-18 NOTE — Progress Notes (Signed)
  Subjective: 2 Days Post-Op Procedure(s) (LRB): INTRAMEDULLARY (IM) NAIL INTERTROCHANTERIC (Right) Patient reports pain as mild.  Reports more pain with therapy. Patient is well, and has had no acute complaints or problems PT and care management to assist with discharge planning.  Likely will need SNF upon discharge. Negative for chest pain and shortness of breath Fever: no Gastrointestinal:Negative for nausea and vomiting Reports that she is passing gas this morning.  Objective: Vital signs in last 24 hours: Temp:  [97.8 F (36.6 C)-98.9 F (37.2 C)] 98.9 F (37.2 C) (09/11 0501) Pulse Rate:  [78-93] 93 (09/11 0501) Resp:  [16] 16 (09/11 0501) BP: (122-144)/(50-63) 144/63 (09/11 0501) SpO2:  [94 %-97 %] 94 % (09/11 0501)  Intake/Output from previous day:  Intake/Output Summary (Last 24 hours) at 03/18/2022 0745 Last data filed at 03/18/2022 0526 Gross per 24 hour  Intake --  Output 1650 ml  Net -1650 ml    Intake/Output this shift: No intake/output data recorded.  Labs: Recent Labs    03/15/22 1738 03/16/22 0751 03/17/22 0719 03/18/22 0620  HGB 12.8 10.7* 9.1* 8.8*   Recent Labs    03/17/22 0719 03/18/22 0620  WBC 6.7 5.8  RBC 2.93* 2.86*  HCT 27.4* 26.9*  PLT 136* 135*   Recent Labs    03/17/22 0719 03/18/22 0620  NA 143 141  K 4.2 3.9  CL 111 108  CO2 28 28  BUN 13 17  CREATININE 0.70 0.65  GLUCOSE 133* 128*  CALCIUM 8.7* 8.3*   No results for input(s): "LABPT", "INR" in the last 72 hours.   EXAM General - Patient is Alert, Appropriate, and Oriented Extremity - ABD soft Neurovascular intact Dorsiflexion/Plantar flexion intact Incision: dressing C/D/I No cellulitis present Compartment soft Dressing/Incision - clean, dry, no drainage noted to the right lateral leg. Motor Function - intact, moving foot and toes well on exam.  Abdomen soft with intact bowel sounds.  Past Medical History:  Diagnosis Date   Actinic keratosis    Basal cell  carcinoma 05/17/2019   R post neck   Melanoma ([place]) 05/31/2019   left lat knee BRESLOW'S DEPTH/MAXIMUM TUMOR THICKNESS: 0.7 MM. Excised 07/05/2019, margins free.    Assessment/Plan: 2 Days Post-Op Procedure(s) (LRB): INTRAMEDULLARY (IM) NAIL INTERTROCHANTERIC (Right) Principal Problem:   Closed right hip fracture (HCC) Active Problems:   Hypokalemia   Essential hypertension   Dyslipidemia  Estimated body mass index is 22.66 kg/m as calculated from the following:   Height as of this encounter: '5\' 4"'$  (1.626 m).   Weight as of this encounter: 59.9 kg. Advance diet Up with therapy D/C IV fluids when tolerating po intake.  Labs reviewed, Hg 8.8 this morning, BP 144/63.  Continue to monitor hemoglobin. Reports some dizziness when getting up with therapy. Up with therapy today, will likely need SNF upon discharge. Patient is passing gas without pain this morning.    DVT Prophylaxis - Lovenox, TED hose, and SCDs Weight-Bearing as tolerated to right leg  J. Cameron Proud, PA-C [facility] Orthopaedic Surgery 03/18/2022, 7:45 AM

## 2022-03-18 NOTE — Progress Notes (Signed)
Physical Therapy Treatment Patient Details Name: Angela Trevino MRN: [id number] DOB: [birth year] Today's Date: 03/18/2022   History of Present Illness Patient is a 88 year old female with closed right hip fracture s/p right IM nail. HIstory of history of skin cancers and peripheral neuropathy.    PT Comments    Patient is agreeable to PT session. Family at the bedside. Patient seated up in chair on arrival to room. The patient continues to require physical assistance with transfers. Max A needed initially with decreased assistance required for standing with repeat transfers. Patient increased ambulation distance to 72f with moderate assistance required using rolling walker. Cues for gait sequencing using the walker with activity tolerance limited by mild nausea and fatigue  with activity. Therapeutic exercises initiated today. Recommend to continue PT to maximize independence with transition to SNF for short term rehab.    Recommendations for follow up therapy are one component of a multi-disciplinary discharge planning process, led by the attending physician.  Recommendations may be updated based on patient status, additional functional criteria and insurance authorization.  Follow Up Recommendations  Skilled nursing-short term rehab (<3 hours/day) Can patient physically be transported by private vehicle: No   Assistance Recommended at Discharge Frequent or constant Supervision/Assistance  Patient can return home with the following A lot of help with walking and/or transfers;A lot of help with bathing/dressing/bathroom;Help with stairs or ramp for entrance;Assist for transportation;Assistance with cooking/housework   Equipment Recommendations  None recommended by PT    Recommendations for Other Services       Precautions / Restrictions Precautions Precautions: Fall Restrictions Weight Bearing Restrictions: Yes RLE Weight Bearing: Weight bearing as tolerated     Mobility  Bed  Mobility               General bed mobility comments: not assessed as patient sitting up on arrival and post session    Transfers Overall transfer level: Needs assistance Equipment used: Rolling walker (2 wheels) Transfers: Sit to/from Stand Sit to Stand: Max assist, Mod assist           General transfer comment: lifting and lowering assistance provided. verbal cues for hand placement for safety. increased assistance required for standing from recliner chair. Mod A required for standing from bed side commode.    Ambulation/Gait Ambulation/Gait assistance: Mod assist, Min assist Gait Distance (Feet): 14 Feet Assistive device: Rolling walker (2 wheels) Gait Pattern/deviations: Step-to pattern, Decreased stride length, Decreased stance time - right Gait velocity: decreased     General Gait Details: maximal cues for sequencing and technique using rolling walker to off load RLE for ease of ambulation. steadying assistance provided with occasional weight shifting assistance to right for advancement of LLE. activity tolerance limited by pain and mild nausea.   Stairs             Wheelchair Mobility    Modified Rankin (Stroke Patients Only)       Balance Overall balance assessment: Needs assistance Sitting-balance support: Feet supported Sitting balance-Leahy Scale: Fair     Standing balance support: Bilateral upper extremity supported Standing balance-Leahy Scale: Poor Standing balance comment: external support required                            Cognition Arousal/Alertness: Awake/alert Behavior During Therapy: WFL for tasks assessed/performed Overall Cognitive Status: Within Functional Limits for tasks assessed  General Comments: patient able to follow all commands without difficulty. cooperative throughout session        Exercises General Exercises - Lower Extremity Ankle Circles/Pumps: AROM,  Strengthening, Both, 10 reps, Seated Quad Sets: AROM, Strengthening, Right, 10 reps, Seated Gluteal Sets: AROM, Strengthening, 10 reps, Supine, Both Hip ABduction/ADduction: AAROM, Strengthening, Right, 10 reps, Seated Other Exercises Other Exercises: verbal and visual cues for exercise technique for strengthening.    General Comments General comments (skin integrity, edema, etc.): blood pressure 133/52 with heart rate 87 in sitting and 100/81 heart rate 96 in standing. mild dizziness reported with standing with some nausea reported also. patient able to complete peri-care after urinating in bed side commode with set-up.      Pertinent Vitals/Pain Pain Assessment Pain Assessment: Faces Faces Pain Scale: Hurts little more Pain Location: R hip Pain Descriptors / Indicators: Discomfort Pain Intervention(s): Limited activity within patient's tolerance, Monitored during session, Repositioned, Ice applied    Home Living                          Prior Function            PT Goals (current goals can now be found in the care plan section) Acute Rehab PT Goals Patient Stated Goal: to return home if possible PT Goal Formulation: With patient Time For Goal Achievement: 03/31/22 Potential to Achieve Goals: Fair Progress towards PT goals: Progressing toward goals    Frequency    7X/week      PT Plan Current plan remains appropriate    Co-evaluation              AM-PAC PT "6 Clicks" Mobility   Outcome Measure  Help needed turning from your back to your side while in a flat bed without using bedrails?: A Lot Help needed moving from lying on your back to sitting on the side of a flat bed without using bedrails?: A Lot Help needed moving to and from a bed to a chair (including a wheelchair)?: A Lot Help needed standing up from a chair using your arms (e.g., wheelchair or bedside chair)?: A Lot Help needed to walk in hospital room?: A Lot Help needed climbing 3-5  steps with a railing? : Total 6 Click Score: 11    End of Session Equipment Utilized During Treatment: Gait belt Activity Tolerance: Patient tolerated treatment well;Patient limited by fatigue Patient left: in chair;with call bell/phone within reach;with family/visitor present Nurse Communication: Mobility status PT Visit Diagnosis: Unsteadiness on feet (R26.81);Muscle weakness (generalized) (M62.81);Pain Pain - Right/Left: Right Pain - part of body: Hip     Time: 3220-2542 PT Time Calculation (min) (ACUTE ONLY): 38 min  Charges:  $Gait Training: 8-22 mins $Therapeutic Exercise: 8-22 mins $Therapeutic Activity: 8-22 mins                     Minna Merritts, PT, MPT    Percell Locus 03/18/2022, 11:18 AM

## 2022-03-18 NOTE — TOC Progression Note (Signed)
Transition of Care [facility]) - Progression Note    Patient Details  Name: Angela Trevino MRN: [id number] Date of Birth: Aug 11, 1926  Transition of Care [facility]) CM/SW [place], RN Phone Number: 03/18/2022, 3:46 PM  Clinical Narrative:     Ins auth approved to go to [facility]  Ref number [id number]  Expected Discharge Plan: [place] Barriers to Discharge: Continued Medical Work up  Expected Discharge Plan and Services Expected Discharge Plan: [place] arrangements for the past 2 months: Single Family Home                                       Social Determinants of Health (SDOH) Interventions    Readmission Risk Interventions     No data to display

## 2022-03-18 NOTE — Progress Notes (Signed)
PROGRESS NOTE    Angela Trevino  ZOX:[id number] DOB: [birth year] DOA: 03/15/2022 PCP: Maryland Pink, MD    Assessment & Plan:   Principal Problem:   Closed right hip fracture [facility]) Active Problems:   Hypokalemia   Essential hypertension   Dyslipidemia  Assessment and Plan: Closed right hip fracture: secondary to mechanical fall. S/p reduction & internal fixation of displaced reverse obliquity intertrochanteric right hip fracture on 03/16/22 as per ortho surg. Ortho surg recs apprec. PT/OT recs SNF. Waiting on SNF placement   Normocytic anemia: w/ component of acute blood loss anemia secondary to recent surgery. No need for a transfusion currently.   Dizziness: hx of this in the past and took meclizine. Meclizine prn  Hypokalemia: WNL today   HLD: continue on statin   HTN: continue on amlodipine   Thrombocytopenia: etiology unclear, labile. Will continue to monitor    DVT prophylaxis: lovenox  Code Status: full Family Communication: discussed pt's care w/ pt's son, Gerald Stabs, and answered his questions Disposition Plan: d/c to SNF, CM is working on placement   Level of care: Med-Surg  Status is: Inpatient Remains inpatient appropriate because: needs SNF placement     Consultants:  Ortho surg   Procedures:   Antimicrobials:    Subjective: Pt c/o malaise   Objective: Vitals:   03/17/22 0917 03/17/22 1704 03/17/22 1951 03/18/22 0501  BP: (!) 133/50 (!) 122/53 124/62 (!) 144/63  Pulse: 78 92 89 93  Resp: '16 16 16 16  '$ Temp: 97.8 F (36.6 C) 98.7 F (37.1 C) 98.3 F (36.8 C) 98.9 F (37.2 C)  TempSrc: Oral     SpO2: 97% 95%  94%  Weight:      Height:        Intake/Output Summary (Last 24 hours) at 03/18/2022 0735 Last data filed at 03/18/2022 0526 Gross per 24 hour  Intake --  Output 1650 ml  Net -1650 ml   Filed Weights   03/15/22 1913  Weight: 59.9 kg    Examination:  General exam: Appears comfortable. Frail appearing  Respiratory system:  clear breath sounds b/l  Cardiovascular system: S1 & S2+. No rubs or clicks  Gastrointestinal system: Abd is soft, NT, ND & hypoactive bowel sounds  Central nervous system: Alert and awake. Moves all extremities  Psychiatry: judgement and insight appears at baseline. Flat mood and affect     Data Reviewed: I have personally reviewed following labs and imaging studies  CBC: Recent Labs  Lab 03/15/22 1738 03/16/22 0751 03/17/22 0719 03/18/22 0620  WBC 7.7 7.1 6.7 5.8  HGB 12.8 10.7* 9.1* 8.8*  HCT 39.0 32.5* 27.4* 26.9*  MCV 93.8 95.6 93.5 94.1  PLT 211 155 136* 811*   Basic Metabolic Panel: Recent Labs  Lab 03/15/22 1738 03/16/22 0751 03/17/22 0719 03/18/22 0620  NA 140 137 143 141  K 3.2* 4.1 4.2 3.9  CL 107 104 111 108  CO2 '24 26 28 28  '$ GLUCOSE 196* 131* 133* 128*  BUN '15 11 13 17  '$ CREATININE 0.75 0.62 0.70 0.65  CALCIUM 9.0 8.2* 8.7* 8.3*   GFR: Estimated Creatinine Clearance: 37.9 mL/min (by C-G formula based on SCr of 0.65 mg/dL). Liver Function Tests: Recent Labs  Lab 03/15/22 1738  AST 24  ALT 22  ALKPHOS 77  BILITOT 0.7  PROT 6.7  ALBUMIN 4.3   No results for input(s): "LIPASE", "AMYLASE" in the last 168 hours. No results for input(s): "AMMONIA" in the last 168 hours. Coagulation Profile: No results  for input(s): "INR", "PROTIME" in the last 168 hours. Cardiac Enzymes: No results for input(s): "CKTOTAL", "CKMB", "CKMBINDEX", "TROPONINI" in the last 168 hours. BNP (last 3 results) No results for input(s): "PROBNP" in the last 8760 hours. HbA1C: No results for input(s): "HGBA1C" in the last 72 hours. CBG: No results for input(s): "GLUCAP" in the last 168 hours. Lipid Profile: No results for input(s): "CHOL", "HDL", "LDLCALC", "TRIG", "CHOLHDL", "LDLDIRECT" in the last 72 hours. Thyroid Function Tests: No results for input(s): "TSH", "T4TOTAL", "FREET4", "T3FREE", "THYROIDAB" in the last 72 hours. Anemia Panel: No results for input(s):  "VITAMINB12", "FOLATE", "FERRITIN", "TIBC", "IRON", "RETICCTPCT" in the last 72 hours. Sepsis Labs: No results for input(s): "PROCALCITON", "LATICACIDVEN" in the last 168 hours.  Recent Results (from the past 240 hour(s))  SARS Coronavirus 2 by RT PCR (hospital order, performed in [facility] hospital lab) *cepheid single result test* Anterior Nasal Swab     Status: None   Collection Time: 03/15/22  5:38 PM   Specimen: Anterior Nasal Swab  Result Value Ref Range Status   SARS Coronavirus 2 by RT PCR NEGATIVE NEGATIVE Final    Comment: (NOTE) SARS-CoV-2 target nucleic acids are NOT DETECTED.  The SARS-CoV-2 RNA is generally detectable in upper and lower respiratory specimens during the acute phase of infection. The lowest concentration of SARS-CoV-2 viral copies this assay can detect is 250 copies / mL. A negative result does not preclude SARS-CoV-2 infection and should not be used as the sole basis for treatment or other patient management decisions.  A negative result may occur with improper specimen collection / handling, submission of specimen other than nasopharyngeal swab, presence of viral mutation(s) within the areas targeted by this assay, and inadequate number of viral copies (<250 copies / mL). A negative result must be combined with clinical observations, patient history, and epidemiological information.  Fact Sheet for Patients:   https://www.patel.info/  Fact Sheet for Healthcare Providers: https://hall.com/  This test is not yet approved or  cleared by the [place] FDA and has been authorized for detection and/or diagnosis of SARS-CoV-2 by FDA under an Emergency Use Authorization (EUA).  This EUA will remain in effect (meaning this test can be used) for the duration of the COVID-19 declaration under Section 564(b)(1) of the Act, 21 U.S.C. section 360bbb-3(b)(1), unless the authorization is terminated or revoked  sooner.  Performed at [facility], [address], [place], [place] [address]   Surgical PCR screen     Status: None   Collection Time: 03/15/22 11:10 PM   Specimen: Nasal Mucosa; Nasal Swab  Result Value Ref Range Status   MRSA, PCR NEGATIVE NEGATIVE Final   Staphylococcus aureus NEGATIVE NEGATIVE Final    Comment: (NOTE) The Xpert SA Assay (FDA approved for NASAL specimens in patients 75 years of age and older), is one component of a comprehensive surveillance program. It is not intended to diagnose infection nor to guide or monitor treatment. Performed at [facility], [address] [place], [place] [address]          Radiology Studies: DG HIP UNILAT WITH PELVIS 2-3 VIEWS RIGHT  Result Date: 03/16/2022 CLINICAL DATA:  Fluoroscopic assistance for internal fixation of fracture of right femur EXAM: DG HIP (WITH OR WITHOUT PELVIS) 2-3V RIGHT COMPARISON:  03/15/2022 FINDINGS: Fluoroscopic images show reduction and internal fixation of comminuted intertrochanteric fracture extending to the proximal shaft with intramedullary rod. Fluoroscopy time 54 seconds. Radiation dose 3.03 mGy. IMPRESSION: Fluoroscopic assistance was provided for reduction  and internal fixation of comminuted fracture of proximal right femur. Electronically Signed   By: Elmer Picker M.D.   On: 03/16/2022 11:47   DG C-Arm 1-60 Min-No Report  Result Date: 03/16/2022 Fluoroscopy was utilized by the requesting physician.  No radiographic interpretation.        Scheduled Meds:  amLODipine  5 mg Oral Daily   docusate sodium  100 mg Oral BID   enoxaparin (LOVENOX) injection  40 mg Subcutaneous Q24H   feeding supplement  237 mL Oral BID BM   gabapentin  200 mg Oral QHS   multivitamin with minerals  1 tablet Oral Daily   ondansetron (ZOFRAN) IV  4 mg Intravenous Once   pravastatin  40 mg Oral Daily   Continuous Infusions:   ceFAZolin (ANCEF) IV       LOS: 3 days    Time spent:  30 mins     Wyvonnia Dusky, MD [facility] Pager [phone]  If 7PM-7AM, please contact night-coverage [email or web address] 03/18/2022, 7:35 AM

## 2022-03-19 DIAGNOSIS — K5909 Other constipation: Secondary | ICD-10-CM

## 2022-03-19 DIAGNOSIS — S72001A Fracture of unspecified part of neck of right femur, initial encounter for closed fracture: Secondary | ICD-10-CM | POA: Diagnosis not present

## 2022-03-19 DIAGNOSIS — D696 Thrombocytopenia, unspecified: Secondary | ICD-10-CM | POA: Diagnosis not present

## 2022-03-19 LAB — CBC
HCT: 26 % — ABNORMAL LOW (ref 36.0–46.0)
Hemoglobin: 8.5 g/dL — ABNORMAL LOW (ref 12.0–15.0)
MCH: 31.4 pg (ref 26.0–34.0)
MCHC: 32.7 g/dL (ref 30.0–36.0)
MCV: 95.9 fL (ref 80.0–100.0)
Platelets: 136 10*3/uL — ABNORMAL LOW (ref 150–400)
RBC: 2.71 MIL/uL — ABNORMAL LOW (ref 3.87–5.11)
RDW: 13.4 % (ref 11.5–15.5)
WBC: 5 10*3/uL (ref 4.0–10.5)
nRBC: 0 % (ref 0.0–0.2)

## 2022-03-19 LAB — BASIC METABOLIC PANEL
Anion gap: 7 (ref 5–15)
BUN: 16 mg/dL (ref 8–23)
CO2: 29 mmol/L (ref 22–32)
Calcium: 8.3 mg/dL — ABNORMAL LOW (ref 8.9–10.3)
Chloride: 104 mmol/L (ref 98–111)
Creatinine, Ser: 0.53 mg/dL (ref 0.44–1.00)
GFR, Estimated: >60 mL/min (ref >60)
Glucose, Bld: 142 mg/dL — ABNORMAL HIGH (ref 70–99)
Potassium: 3.9 mmol/L (ref 3.5–5.1)
Sodium: 140 mmol/L (ref 135–145)

## 2022-03-19 MED ORDER — ENOXAPARIN SODIUM 40 MG/0.4ML IJ SOSY: 40.0000 mg | PREFILLED_SYRINGE | INTRAMUSCULAR | 0 refills | Status: AC

## 2022-03-19 MED ORDER — LACTULOSE 10 GM/15ML PO SOLN
20.0000 g | Freq: Once | ORAL | Status: AC
  Administered 2022-03-19: 20 g via ORAL
  Filled 2022-03-19: qty 30

## 2022-03-19 MED ORDER — DOCUSATE SODIUM 100 MG PO CAPS
200.0000 mg | ORAL_CAPSULE | Freq: Two times a day (BID) | ORAL | Status: DC
  Administered 2022-03-19 – 2022-03-20 (×3): 200 mg via ORAL
  Filled 2022-03-19 (×3): qty 2

## 2022-03-19 MED ORDER — HYDROCODONE-ACETAMINOPHEN 5-325 MG PO TABS: 1.0000 | ORAL_TABLET | Freq: Four times a day (QID) | ORAL | 0 refills | Status: DC | PRN

## 2022-03-19 NOTE — Progress Notes (Signed)
  Subjective: 3 Days Post-Op Procedure(s) (LRB): INTRAMEDULLARY (IM) NAIL INTERTROCHANTERIC (Right) Patient reports pain as mild.  Reports more pain with therapy. Patient is well, and has had no acute complaints or problems Plan is for discharge to [facility] today pending a BM. Negative for chest pain and shortness of breath Fever: no Gastrointestinal:Negative for nausea and vomiting Reports that she is passing gas this morning.  Has not had a BM yet.  Objective: Vital signs in last 24 hours: Temp:  [98.4 F (36.9 C)-99.2 F (37.3 C)] 99.2 F (37.3 C) (09/11 2103) Pulse Rate:  [74-86] 86 (09/11 2103) Resp:  [14-16] 16 (09/11 2103) BP: (114-130)/(49-57) 130/56 (09/11 2103) SpO2:  [94 %-97 %] 97 % (09/11 2103)  Intake/Output from previous day: No intake or output data in the 24 hours ending 03/19/22 0742   Intake/Output this shift: No intake/output data recorded.  Labs: Recent Labs    03/16/22 0751 03/17/22 0719 03/18/22 0620 03/19/22 0604  HGB 10.7* 9.1* 8.8* 8.5*   Recent Labs    03/18/22 0620 03/19/22 0604  WBC 5.8 5.0  RBC 2.86* 2.71*  HCT 26.9* 26.0*  PLT 135* 136*   Recent Labs    03/18/22 0620 03/19/22 0604  NA 141 140  K 3.9 3.9  CL 108 104  CO2 28 29  BUN 17 16  CREATININE 0.65 0.53  GLUCOSE 128* 142*  CALCIUM 8.3* 8.3*   No results for input(s): "LABPT", "INR" in the last 72 hours.   EXAM General - Patient is Alert, Appropriate, and Oriented Extremity - ABD soft Neurovascular intact Dorsiflexion/Plantar flexion intact Incision: dressing C/D/I No cellulitis present Compartment soft Dressing/Incision - clean, dry, no drainage noted to the right lateral leg. Motor Function - intact, moving foot and toes well on exam.  Abdomen soft with intact bowel sounds.  Past Medical History:  Diagnosis Date   Actinic keratosis    Basal cell carcinoma 05/17/2019   R post neck   Melanoma ([place]) 05/31/2019   left lat knee BRESLOW'S DEPTH/MAXIMUM  TUMOR THICKNESS: 0.7 MM. Excised 07/05/2019, margins free.    Assessment/Plan: 3 Days Post-Op Procedure(s) (LRB): INTRAMEDULLARY (IM) NAIL INTERTROCHANTERIC (Right) Principal Problem:   Closed right hip fracture (HCC) Active Problems:   Hypokalemia   Essential hypertension   Dyslipidemia  Estimated body mass index is 22.66 kg/m as calculated from the following:   Height as of this encounter: '5\' 4"'$  (1.626 m).   Weight as of this encounter: 59.9 kg. Advance diet Up with therapy D/C IV fluids when tolerating po intake.  Labs reviewed, Hg 8.5 this morning. Denies any dizziness this morning.   Patient has been approved for [facility] pending a BM. Will add lactulose in addition to suppository for constipation. Plan for discharge to [facility] today pending a BM.  Upon discharge, staples can be removed by SNF on 03/30/22. Continue Lovenox '40mg'$  daily for 14 days for DVT prophylaxis. Follow-up with [facility] in 6 weeks for x-rays of the right femur.  DVT Prophylaxis - Lovenox, TED hose, and SCDs Weight-Bearing as tolerated to right leg  J. Cameron Proud, PA-C [facility] 03/19/2022, 7:42 AM

## 2022-03-19 NOTE — Discharge Instructions (Signed)

## 2022-03-19 NOTE — Progress Notes (Signed)
PROGRESS NOTE   HPI was taken from Dr. Sidney Ace: Angela Trevino is a 88 y.o. female with medical history significant for hypertension and dyslipidemia, who presented to the ER with acute onset of accidental mechanical fall.  The patient was sitting outside on her patio in a chair and when she got up and bent to get her walker she lost balance and fell on her right side.  She had subsequent right hip pain and inability to move.  She denies any presyncope or syncope.  She denied any paresthesias or focal muscle weakness.  No chest pain or palpitations.  No recent cough or wheezing or dyspnea.  No fever or chills.  No dysuria, oliguria or hematuria or flank pain.   ED Course: When she came to the ER, BP was 174/77 with otherwise normal vital signs.  Labs revealed mild hypokalemia 3.2 with otherwise unremarkable CMP and CBC was within normal.  Blood group was a negative with negative antibody screen.  COVID-19 PCR came back negative EKG as reviewed by me : EKG showed normal sinus rhythm with a rate of 80 with PVCs and probable left atrial enlargement. Imaging: Portable chest x-ray showed no acute cardiopulmonary disease.  Right hip and pelvic x-ray a revealed acute comminuted and displaced proximal femoral fracture involving the intertrochanteric region and subtrochanteric shaft.  As per Dr. Jimmye Norman 9/9-9/12/23: Pt presented after a mechanical fall at home and found to have closed right hip fracture. Pt is s/p reduction & internal fixation of displace reverse obliquity intertrochanteric right fracture on 03/16/22 as per ortho surg. PT/OT recs SNF. Pt did not have a BM until today at 4PM and pt would have to go to SNF tomorrow as per CM.     Angela Trevino  YPP:[id number] DOB: August 29, 1926 DOA: 03/15/2022 PCP: Maryland Pink, MD    Assessment & Plan:   Principal Problem:   Closed right hip fracture [facility]) Active Problems:   Hypokalemia   Essential hypertension   Dyslipidemia  Assessment and  Plan: Closed right hip fracture: secondary to mechanical fall. S/p reduction & internal fixation of displaced reverse obliquity intertrochanteric right hip fracture on 03/16/22 as per ortho surg. Norco, morphine prn for pain. PT/OT recs SNF. Will d/c to SNF tomorrow. Staples can be removed by SNF on 03/30/22, continue Lovenox '40mg'$  daily for 14 days for DVT prophylaxis, &  follow-up with [facility] in 6 weeks for x-rays of the right femur as per ortho surg      Constipation: continue on colace & started on lactulose today. Did not have BM until 4pm today so pt can d/c to SNF tomrrow   Normocytic anemia: w/ component of acute blood loss anemia secondary to recent above surgery. Will transfuse if Hb < 7.0  Dizziness: hx of this in the past & took meclizine. Meclizine prn   Hypokalemia: resolved  HLD: continue on statin   HTN: continue on amlodipine   Thrombocytopenia: etiology unclear, labile. Will continue to monitor    DVT prophylaxis: lovenox  Code Status: full Family Communication: discussed pt's care w/ pt's son at bedside and answered his questions Disposition Plan: d/c to SNF tomorrow  Level of care: Med-Surg  Status is: Inpatient Remains inpatient appropriate because: did not have a BM until 4pm today so has to go to SNF tomorrow as per CM     Consultants:  Ortho surg   Procedures:   Antimicrobials:    Subjective: Pt c/o fatigue    Objective: Vitals:  03/18/22 0501 03/18/22 0819 03/18/22 1552 03/18/22 2103  BP: (!) 144/63 (!) 121/57 (!) 114/49 (!) 130/56  Pulse: 93 77 74 86  Resp: '16 14 14 16  '$ Temp: 98.9 F (37.2 C) 98.4 F (36.9 C) 98.7 F (37.1 C) 99.2 F (37.3 C)  TempSrc:  Oral    SpO2: 94% 94% 97% 97%  Weight:      Height:       No intake or output data in the 24 hours ending 03/19/22 0737  Filed Weights   03/15/22 1913  Weight: 59.9 kg    Examination:  General exam: Appears calm & comfortable. Frail appearing  Respiratory system:  clear breath sounds b/l. No wheezes, rales Cardiovascular system: S1/S2+. No rubs or clicks  Gastrointestinal system: Abd is soft, NT, ND & hypoactive bowel sounds  Central nervous system: Alert and awake. Moves all extremities  Psychiatry: judgement and insight appears at baseline. Flat mood and affect     Data Reviewed: I have personally reviewed following labs and imaging studies  CBC: Recent Labs  Lab 03/15/22 1738 03/16/22 0751 03/17/22 0719 03/18/22 0620 03/19/22 0604  WBC 7.7 7.1 6.7 5.8 5.0  HGB 12.8 10.7* 9.1* 8.8* 8.5*  HCT 39.0 32.5* 27.4* 26.9* 26.0*  MCV 93.8 95.6 93.5 94.1 95.9  PLT 211 155 136* 135* 657*   Basic Metabolic Panel: Recent Labs  Lab 03/15/22 1738 03/16/22 0751 03/17/22 0719 03/18/22 0620 03/19/22 0604  NA 140 137 143 141 140  K 3.2* 4.1 4.2 3.9 3.9  CL 107 104 111 108 104  CO2 '24 26 28 28 29  '$ GLUCOSE 196* 131* 133* 128* 142*  BUN '15 11 13 17 16  '$ CREATININE 0.75 0.62 0.70 0.65 0.53  CALCIUM 9.0 8.2* 8.7* 8.3* 8.3*   GFR: Estimated Creatinine Clearance: 37.9 mL/min (by C-G formula based on SCr of 0.53 mg/dL). Liver Function Tests: Recent Labs  Lab 03/15/22 1738  AST 24  ALT 22  ALKPHOS 77  BILITOT 0.7  PROT 6.7  ALBUMIN 4.3   No results for input(s): "LIPASE", "AMYLASE" in the last 168 hours. No results for input(s): "AMMONIA" in the last 168 hours. Coagulation Profile: No results for input(s): "INR", "PROTIME" in the last 168 hours. Cardiac Enzymes: No results for input(s): "CKTOTAL", "CKMB", "CKMBINDEX", "TROPONINI" in the last 168 hours. BNP (last 3 results) No results for input(s): "PROBNP" in the last 8760 hours. HbA1C: No results for input(s): "HGBA1C" in the last 72 hours. CBG: No results for input(s): "GLUCAP" in the last 168 hours. Lipid Profile: No results for input(s): "CHOL", "HDL", "LDLCALC", "TRIG", "CHOLHDL", "LDLDIRECT" in the last 72 hours. Thyroid Function Tests: No results for input(s): "TSH", "T4TOTAL",  "FREET4", "T3FREE", "THYROIDAB" in the last 72 hours. Anemia Panel: No results for input(s): "VITAMINB12", "FOLATE", "FERRITIN", "TIBC", "IRON", "RETICCTPCT" in the last 72 hours. Sepsis Labs: No results for input(s): "PROCALCITON", "LATICACIDVEN" in the last 168 hours.  Recent Results (from the past 240 hour(s))  SARS Coronavirus 2 by RT PCR (hospital order, performed in [facility] hospital lab) *cepheid single result test* Anterior Nasal Swab     Status: None   Collection Time: 03/15/22  5:38 PM   Specimen: Anterior Nasal Swab  Result Value Ref Range Status   SARS Coronavirus 2 by RT PCR NEGATIVE NEGATIVE Final    Comment: (NOTE) SARS-CoV-2 target nucleic acids are NOT DETECTED.  The SARS-CoV-2 RNA is generally detectable in upper and lower respiratory specimens during the acute phase of infection. The lowest concentration  of SARS-CoV-2 viral copies this assay can detect is 250 copies / mL. A negative result does not preclude SARS-CoV-2 infection and should not be used as the sole basis for treatment or other patient management decisions.  A negative result may occur with improper specimen collection / handling, submission of specimen other than nasopharyngeal swab, presence of viral mutation(s) within the areas targeted by this assay, and inadequate number of viral copies (<250 copies / mL). A negative result must be combined with clinical observations, patient history, and epidemiological information.  Fact Sheet for Patients:   https://www.patel.info/  Fact Sheet for Healthcare Providers: https://hall.com/  This test is not yet approved or  cleared by the [place] FDA and has been authorized for detection and/or diagnosis of SARS-CoV-2 by FDA under an Emergency Use Authorization (EUA).  This EUA will remain in effect (meaning this test can be used) for the duration of the COVID-19 declaration under Section 564(b)(1) of the Act,  21 U.S.C. section 360bbb-3(b)(1), unless the authorization is terminated or revoked sooner.  Performed at [facility], [address], [place], [place] [address]   Surgical PCR screen     Status: None   Collection Time: 03/15/22 11:10 PM   Specimen: Nasal Mucosa; Nasal Swab  Result Value Ref Range Status   MRSA, PCR NEGATIVE NEGATIVE Final   Staphylococcus aureus NEGATIVE NEGATIVE Final    Comment: (NOTE) The Xpert SA Assay (FDA approved for NASAL specimens in patients 39 years of age and older), is one component of a comprehensive surveillance program. It is not intended to diagnose infection nor to guide or monitor treatment. Performed at [facility], [address] [place], [place] [address]          Radiology Studies: No results found.      Scheduled Meds:  amLODipine  5 mg Oral Daily   docusate sodium  100 mg Oral BID   enoxaparin (LOVENOX) injection  40 mg Subcutaneous Q24H   feeding supplement  237 mL Oral BID BM   gabapentin  200 mg Oral QHS   multivitamin with minerals  1 tablet Oral Daily   ondansetron (ZOFRAN) IV  4 mg Intravenous Once   pravastatin  40 mg Oral Daily   Continuous Infusions:     LOS: 4 days    Time spent: 25 mins     Wyvonnia Dusky, MD [facility] Pager [phone]  If 7PM-7AM, please contact night-coverage [email or web address] 03/19/2022, 7:37 AM

## 2022-03-19 NOTE — Progress Notes (Signed)
Physical Therapy Treatment Patient Details Name: Angela Trevino MRN: [id number] DOB: 01-21-1927 Today's Date: 03/19/2022   History of Present Illness Patient is a 88 year old female with closed right hip fracture s/p right IM nail. HIstory of history of skin cancers and peripheral neuropathy.    PT Comments    Patient is agreeable to PT session. She reports moderate pain in the right hip only with weight bearing activity. Increased independence with functional mobility and increased activity tolerance this session. Patient with increased gait distance ambulated with rolling walker with minimal assistance required. She will sill require SNF placement at discharge. PT will continue to follow to maximize independence and decrease caregiver burden.    Recommendations for follow up therapy are one component of a multi-disciplinary discharge planning process, led by the attending physician.  Recommendations may be updated based on patient status, additional functional criteria and insurance authorization.  Follow Up Recommendations  Skilled nursing-short term rehab (<3 hours/day) Can patient physically be transported by private vehicle: No   Assistance Recommended at Discharge Frequent or constant Supervision/Assistance  Patient can return home with the following A lot of help with walking and/or transfers;A lot of help with bathing/dressing/bathroom;Help with stairs or ramp for entrance;Assist for transportation;Assistance with cooking/housework   Equipment Recommendations  None recommended by PT    Recommendations for Other Services       Precautions / Restrictions Precautions Precautions: Fall Restrictions Weight Bearing Restrictions: Yes RLE Weight Bearing: Weight bearing as tolerated     Mobility  Bed Mobility               General bed mobility comments: not assessed as patient sitting up on arrival and post session    Transfers Overall transfer level: Needs  assistance Equipment used: Rolling walker (2 wheels) Transfers: Sit to/from Stand Sit to Stand: Mod assist           General transfer comment: verbal cues for hand positioning and RLE placement with sitting. lifting assistance required    Ambulation/Gait Ambulation/Gait assistance: Min guard, Min assist Gait Distance (Feet): 30 Feet Assistive device: Rolling walker (2 wheels) Gait Pattern/deviations: Step-to pattern, Antalgic, Decreased stance time - right, Decreased stride length Gait velocity: decreased     General Gait Details: verbal cues for sequencing and technique using rolling walker for support. no dizziness reported with ambulation today. gait distance increased today compared to yesterday   Stairs             Wheelchair Mobility    Modified Rankin (Stroke Patients Only)       Balance                                            Cognition Arousal/Alertness: Awake/alert Behavior During Therapy: WFL for tasks assessed/performed Overall Cognitive Status: Within Functional Limits for tasks assessed                                 General Comments: patient able to follow all commands without difficulty. cooperative throughout session        Exercises General Exercises - Lower Extremity Ankle Circles/Pumps: AROM, Strengthening, Both, 10 reps, Supine Quad Sets: AROM, Strengthening, Right, 10 reps, Supine Gluteal Sets: AROM, Strengthening, Both, 10 reps, Seated Other Exercises Other Exercises: verbal cues for exercise technique  General Comments        Pertinent Vitals/Pain Pain Assessment Pain Assessment: Faces Faces Pain Scale: Hurts little more ("moderate pain") Pain Location: R hip Pain Descriptors / Indicators: Discomfort Pain Intervention(s): Limited activity within patient's tolerance, Monitored during session, Repositioned, Ice applied    Home Living                          Prior Function             PT Goals (current goals can now be found in the care plan section) Acute Rehab PT Goals Patient Stated Goal: to return home if possible PT Goal Formulation: With patient Time For Goal Achievement: 03/31/22 Potential to Achieve Goals: Fair Progress towards PT goals: Progressing toward goals    Frequency    7X/week      PT Plan Current plan remains appropriate    Co-evaluation              AM-PAC PT "6 Clicks" Mobility   Outcome Measure  Help needed turning from your back to your side while in a flat bed without using bedrails?: A Lot Help needed moving from lying on your back to sitting on the side of a flat bed without using bedrails?: A Lot Help needed moving to and from a bed to a chair (including a wheelchair)?: A Lot Help needed standing up from a chair using your arms (e.g., wheelchair or bedside chair)?: A Lot Help needed to walk in hospital room?: A Little Help needed climbing 3-5 steps with a railing? : Total 6 Click Score: 12    End of Session Equipment Utilized During Treatment: Gait belt Activity Tolerance: Patient tolerated treatment well;Patient limited by fatigue Patient left: in chair;with call bell/phone within reach;with family/visitor present   PT Visit Diagnosis: Unsteadiness on feet (R26.81);Muscle weakness (generalized) (M62.81);Pain Pain - Right/Left: Right Pain - part of body: Hip     Time: 1003-1030 PT Time Calculation (min) (ACUTE ONLY): 27 min  Charges:  $Gait Training: 8-22 mins $Therapeutic Exercise: 8-22 mins                    Minna Merritts, PT, MPT    Percell Locus 03/19/2022, 11:51 AM

## 2022-03-19 NOTE — Plan of Care (Signed)

## 2022-03-20 DIAGNOSIS — T402X5A Adverse effect of other opioids, initial encounter: Secondary | ICD-10-CM

## 2022-03-20 DIAGNOSIS — K5903 Drug induced constipation: Secondary | ICD-10-CM | POA: Diagnosis not present

## 2022-03-20 DIAGNOSIS — S72141A Displaced intertrochanteric fracture of right femur, initial encounter for closed fracture: Principal | ICD-10-CM

## 2022-03-20 DIAGNOSIS — I10 Essential (primary) hypertension: Secondary | ICD-10-CM | POA: Diagnosis not present

## 2022-03-20 LAB — BASIC METABOLIC PANEL
Anion gap: 4 — ABNORMAL LOW (ref 5–15)
BUN: 15 mg/dL (ref 8–23)
CO2: 30 mmol/L (ref 22–32)
Calcium: 8.4 mg/dL — ABNORMAL LOW (ref 8.9–10.3)
Chloride: 105 mmol/L (ref 98–111)
Creatinine, Ser: 0.66 mg/dL (ref 0.44–1.00)
GFR, Estimated: >60 mL/min (ref >60)
Glucose, Bld: 121 mg/dL — ABNORMAL HIGH (ref 70–99)
Potassium: 3.9 mmol/L (ref 3.5–5.1)
Sodium: 139 mmol/L (ref 135–145)

## 2022-03-20 LAB — CBC
HCT: 24.7 % — ABNORMAL LOW (ref 36.0–46.0)
Hemoglobin: 8 g/dL — ABNORMAL LOW (ref 12.0–15.0)
MCH: 30.9 pg (ref 26.0–34.0)
MCHC: 32.4 g/dL (ref 30.0–36.0)
MCV: 95.4 fL (ref 80.0–100.0)
Platelets: 166 10*3/uL (ref 150–400)
RBC: 2.59 MIL/uL — ABNORMAL LOW (ref 3.87–5.11)
RDW: 13.2 % (ref 11.5–15.5)
WBC: 4.1 10*3/uL (ref 4.0–10.5)
nRBC: 0 % (ref 0.0–0.2)

## 2022-03-20 MED ORDER — BISACODYL 10 MG RE SUPP: 10.0000 mg | Freq: Every day | RECTAL | 0 refills | Status: AC | PRN

## 2022-03-20 MED ORDER — GABAPENTIN 100 MG PO CAPS: 200.0000 mg | ORAL_CAPSULE | Freq: Every day | ORAL | 0 refills | Status: DC

## 2022-03-20 MED ORDER — ENSURE ENLIVE PO LIQD: 237.0000 mL | Freq: Two times a day (BID) | ORAL | 12 refills | Status: DC

## 2022-03-20 MED ORDER — ADULT MULTIVITAMIN W/MINERALS CH: 1.0000 | ORAL_TABLET | Freq: Every day | ORAL | 1 refills | Status: AC

## 2022-03-20 MED ORDER — TRAZODONE HCL 50 MG PO TABS
25.0000 mg | ORAL_TABLET | Freq: Every evening | ORAL | 0 refills | Status: AC | PRN
Start: 2022-03-20 — End: ?

## 2022-03-20 MED ORDER — ACETAMINOPHEN 325 MG PO TABS: 325.0000 mg | ORAL_TABLET | Freq: Four times a day (QID) | ORAL | Status: AC | PRN

## 2022-03-20 MED ORDER — DOCUSATE SODIUM 100 MG PO CAPS: 200.0000 mg | ORAL_CAPSULE | Freq: Two times a day (BID) | ORAL | 0 refills | Status: AC

## 2022-03-20 NOTE — Hospital Course (Signed)
88 year old female with past medical history of hypertension and hyperlipidemia who presented to the emergency room after a mechanical fall on 9/8.  Emergency department, patient found to have a blood pressure of 174/77, potassium 3.2 and right hip and pelvic x-rays noted an acute comminuted and displaced proximal femoral fracture involving intertrochanteric region and subtrochanteric shaft.  Patient was seen by orthopedic surgery and underwent an ORIF on 9/9.  Postop, patient had issues with constipation secondary to pain medication and finally had bowel movement on 9/12.  She has been started on Lovenox for DVT prophylaxis.

## 2022-03-20 NOTE — Plan of Care (Signed)
Problem: Education: Goal: Knowledge of General Education information will improve Description: Including pain rating scale, medication(s)/side effects and non-pharmacologic comfort measures 03/20/2022 1240 by Ardelia Mems, RN Outcome: Adequate for Discharge 03/20/2022 0848 by Ardelia Mems, RN Outcome: Progressing   Problem: Health Behavior/Discharge Planning: Goal: Ability to manage health-related needs will improve 03/20/2022 1240 by Ardelia Mems, RN Outcome: Adequate for Discharge 03/20/2022 0848 by Ardelia Mems, RN Outcome: Progressing   Problem: Clinical Measurements: Goal: Ability to maintain clinical measurements within normal limits will improve 03/20/2022 1240 by Ardelia Mems, RN Outcome: Adequate for Discharge 03/20/2022 0848 by Ardelia Mems, RN Outcome: Progressing Goal: Will remain free from infection 03/20/2022 1240 by Ardelia Mems, RN Outcome: Adequate for Discharge 03/20/2022 0848 by Ardelia Mems, RN Outcome: Progressing Goal: Diagnostic test results will improve 03/20/2022 1240 by Ardelia Mems, RN Outcome: Adequate for Discharge 03/20/2022 0848 by Ardelia Mems, RN Outcome: Progressing Goal: Respiratory complications will improve 03/20/2022 1240 by Ardelia Mems, RN Outcome: Adequate for Discharge 03/20/2022 0848 by Ardelia Mems, RN Outcome: Progressing Goal: Cardiovascular complication will be avoided 03/20/2022 1240 by Ardelia Mems, RN Outcome: Adequate for Discharge 03/20/2022 0848 by Ardelia Mems, RN Outcome: Progressing   Problem: Activity: Goal: Risk for activity intolerance will decrease 03/20/2022 1240 by Ardelia Mems, RN Outcome: Adequate for Discharge 03/20/2022 0848 by Ardelia Mems, RN Outcome: Progressing   Problem: Nutrition: Goal: Adequate nutrition will be maintained 03/20/2022 1240 by Ardelia Mems, RN Outcome: Adequate for  Discharge 03/20/2022 0848 by Ardelia Mems, RN Outcome: Progressing   Problem: Coping: Goal: Level of anxiety will decrease 03/20/2022 1240 by Ardelia Mems, RN Outcome: Adequate for Discharge 03/20/2022 0848 by Ardelia Mems, RN Outcome: Progressing   Problem: Elimination: Goal: Will not experience complications related to bowel motility 03/20/2022 1240 by Ardelia Mems, RN Outcome: Adequate for Discharge 03/20/2022 0848 by Ardelia Mems, RN Outcome: Progressing Goal: Will not experience complications related to urinary retention 03/20/2022 1240 by Ardelia Mems, RN Outcome: Adequate for Discharge 03/20/2022 0848 by Ardelia Mems, RN Outcome: Progressing   Problem: Pain Managment: Goal: General experience of comfort will improve 03/20/2022 1240 by Ardelia Mems, RN Outcome: Adequate for Discharge 03/20/2022 0848 by Ardelia Mems, RN Outcome: Progressing   Problem: Safety: Goal: Ability to remain free from injury will improve 03/20/2022 1240 by Ardelia Mems, RN Outcome: Adequate for Discharge 03/20/2022 0848 by Ardelia Mems, RN Outcome: Progressing   Problem: Skin Integrity: Goal: Risk for impaired skin integrity will decrease 03/20/2022 1240 by Ardelia Mems, RN Outcome: Adequate for Discharge 03/20/2022 0848 by Ardelia Mems, RN Outcome: Progressing   Problem: Increased Nutrient Needs (NI-5.1) Goal: Food and/or nutrient delivery Description: Individualized approach for food/nutrient provision. Outcome: Adequate for Discharge   Problem: Education: Goal: Knowledge of General Education information will improve Description: Including pain rating scale, medication(s)/side effects and non-pharmacologic comfort measures 03/20/2022 1240 by Ardelia Mems, RN Outcome: Adequate for Discharge 03/20/2022 0848 by Ardelia Mems, RN Outcome: Progressing   Problem: Health Behavior/Discharge  Planning: Goal: Ability to manage health-related needs will improve 03/20/2022 1240 by Ardelia Mems, RN Outcome: Adequate for Discharge 03/20/2022 0848 by Ardelia Mems, RN Outcome: Progressing   Problem: Clinical Measurements: Goal: Ability to maintain clinical measurements within normal limits will improve 03/20/2022 1240 by Ardelia Mems, RN Outcome: Adequate for Discharge 03/20/2022 0848 by Ardelia Mems, RN Outcome: Progressing Goal: Will remain free from infection 03/20/2022 1240 by Ardelia Mems, RN Outcome: Adequate for Discharge 03/20/2022 0848 by Ardelia Mems, RN Outcome: Progressing Goal: Diagnostic test results will improve 03/20/2022 1240 by Kimberli Winne,  Benjamine Mola, RN Outcome: Adequate for Discharge 03/20/2022 0848 by Ardelia Mems, RN Outcome: Progressing Goal: Respiratory complications will improve 03/20/2022 1240 by Ardelia Mems, RN Outcome: Adequate for Discharge 03/20/2022 0848 by Ardelia Mems, RN Outcome: Progressing Goal: Cardiovascular complication will be avoided 03/20/2022 1240 by Ardelia Mems, RN Outcome: Adequate for Discharge 03/20/2022 0848 by Ardelia Mems, RN Outcome: Progressing   Problem: Activity: Goal: Risk for activity intolerance will decrease 03/20/2022 1240 by Ardelia Mems, RN Outcome: Adequate for Discharge 03/20/2022 0848 by Ardelia Mems, RN Outcome: Progressing   Problem: Nutrition: Goal: Adequate nutrition will be maintained 03/20/2022 1240 by Ardelia Mems, RN Outcome: Adequate for Discharge 03/20/2022 0848 by Ardelia Mems, RN Outcome: Progressing   Problem: Coping: Goal: Level of anxiety will decrease 03/20/2022 1240 by Ardelia Mems, RN Outcome: Adequate for Discharge 03/20/2022 0848 by Ardelia Mems, RN Outcome: Progressing   Problem: Elimination: Goal: Will not experience complications related to bowel motility 03/20/2022 1240  by Ardelia Mems, RN Outcome: Adequate for Discharge 03/20/2022 0848 by Ardelia Mems, RN Outcome: Progressing Goal: Will not experience complications related to urinary retention 03/20/2022 1240 by Ardelia Mems, RN Outcome: Adequate for Discharge 03/20/2022 0848 by Ardelia Mems, RN Outcome: Progressing   Problem: Pain Managment: Goal: General experience of comfort will improve 03/20/2022 1240 by Ardelia Mems, RN Outcome: Adequate for Discharge 03/20/2022 0848 by Ardelia Mems, RN Outcome: Progressing   Problem: Safety: Goal: Ability to remain free from injury will improve 03/20/2022 1240 by Ardelia Mems, RN Outcome: Adequate for Discharge 03/20/2022 0848 by Ardelia Mems, RN Outcome: Progressing   Problem: Skin Integrity: Goal: Risk for impaired skin integrity will decrease 03/20/2022 1240 by Ardelia Mems, RN Outcome: Adequate for Discharge 03/20/2022 0848 by Ardelia Mems, RN Outcome: Progressing   Problem: Acute Rehab PT Goals(only PT should resolve) Goal: Pt Will Go Supine/Side To Sit Outcome: Adequate for Discharge Goal: Patient Will Transfer Sit To/From Stand Outcome: Adequate for Discharge Goal: Pt Will Ambulate Outcome: Adequate for Discharge Goal: Pt Will Go Up/Down Stairs Outcome: Adequate for Discharge

## 2022-03-20 NOTE — Plan of Care (Signed)

## 2022-03-20 NOTE — TOC Progression Note (Signed)
Transition of Care [facility]) - Progression Note    Patient Details  Name: Angela Trevino MRN: [id number] Date of Birth: 1926-11-27  Transition of Care [facility]) CM/SW [place], RN Phone Number: 03/20/2022, 12:58 PM  Clinical Narrative:     Aline August her son that the patient will go to [facility] room 108 She will need to transport via EMS Ems called to transport Several ahead of her on list   Expected Discharge Plan: [place] Barriers to Discharge: Continued Medical Work up  Expected Discharge Plan and Services Expected Discharge Plan: [place] arrangements for the past 2 months: Single Family Home Expected Discharge Date: 03/20/22                                     Social Determinants of Health (SDOH) Interventions    Readmission Risk Interventions     No data to display

## 2022-03-20 NOTE — Progress Notes (Signed)
AVS and discharge summary viewed and all questions/concerns addressed. Pt verbalized complete understanding of these instructions w/ no further questions. Active LDA's discontinued. Report called to [facility]

## 2022-03-20 NOTE — Progress Notes (Signed)
This Rn attempted to call report to receiving nurse/facility however call was diverted to answering macin

## 2022-03-20 NOTE — Progress Notes (Signed)
Physical Therapy Treatment Patient Details Name: Angela Trevino MRN: [id number] DOB: [birth year] Today's Date: 03/20/2022   History of Present Illness Patient is a 88 year old female with closed right hip fracture s/p right IM nail. HIstory of history of skin cancers and peripheral neuropathy.    PT Comments    The pt presents this session in good spirits. She demonstrates improved tolerance to gait distance with reports of less fatigue and pain following 30'. She continues to required PT services to improve gait distances to household distance, normalize gait pattern and improve RLE strength. PT will continue to follow. She continues to be a good candidate for SNF once medically ready to d/c.     Recommendations for follow up therapy are one component of a multi-disciplinary discharge planning process, led by the attending physician.  Recommendations may be updated based on patient status, additional functional criteria and insurance authorization.  Follow Up Recommendations  Skilled nursing-short term rehab (<3 hours/day) Can patient physically be transported by private vehicle: Yes   Assistance Recommended at Discharge Frequent or constant Supervision/Assistance  Patient can return home with the following Help with stairs or ramp for entrance;Assist for transportation;Assistance with cooking/housework;A little help with walking and/or transfers;A little help with bathing/dressing/bathroom   Equipment Recommendations  None recommended by PT    Recommendations for Other Services       Precautions / Restrictions Precautions Precautions: Fall Restrictions Weight Bearing Restrictions: Yes RLE Weight Bearing: Weight bearing as tolerated     Mobility  Bed Mobility                    Transfers Overall transfer level: Needs assistance Equipment used: Rolling walker (2 wheels) Transfers: Sit to/from Stand Sit to Stand: Min assist           General transfer comment:  verbal cues for hand positioning, assistance for forward translation during sit<>stand.    Ambulation/Gait Ambulation/Gait assistance: Min assist, Min guard Gait Distance (Feet): 30 Feet Assistive device: Rolling walker (2 wheels) Gait Pattern/deviations: Step-to pattern, Antalgic, Decreased stance time - right, Decreased stride length, Decreased step length - left       General Gait Details: Pt educated on the future progression of step through gait pattern without pausing during ambulation.   Stairs             Wheelchair Mobility    Modified Rankin (Stroke Patients Only)       Balance Overall balance assessment: Needs assistance, History of Falls Sitting-balance support: Feet supported Sitting balance-Leahy Scale: Good     Standing balance support: During functional activity, Reliant on assistive device for balance Standing balance-Leahy Scale: Fair                              Cognition Arousal/Alertness: Awake/alert Behavior During Therapy: WFL for tasks assessed/performed Overall Cognitive Status: Within Functional Limits for tasks assessed                                          Exercises      General Comments        Pertinent Vitals/Pain Pain Assessment Pain Assessment: No/denies pain    Home Living  Prior Function            PT Goals (current goals can now be found in the care plan section) Acute Rehab PT Goals Patient Stated Goal: to return home if possible PT Goal Formulation: With patient Time For Goal Achievement: 03/31/22 Potential to Achieve Goals: Fair Progress towards PT goals: Progressing toward goals    Frequency    7X/week      PT Plan Current plan remains appropriate    Co-evaluation              AM-PAC PT "6 Clicks" Mobility   Outcome Measure  Help needed turning from your back to your side while in a flat bed without using bedrails?: A  Lot Help needed moving from lying on your back to sitting on the side of a flat bed without using bedrails?: A Lot Help needed moving to and from a bed to a chair (including a wheelchair)?: A Little Help needed standing up from a chair using your arms (e.g., wheelchair or bedside chair)?: A Little Help needed to walk in hospital room?: A Little Help needed climbing 3-5 steps with a railing? : A Lot 6 Click Score: 15    End of Session Equipment Utilized During Treatment: Gait belt Activity Tolerance: Patient tolerated treatment well;Patient limited by pain Patient left: in chair;with call bell/phone within reach;with family/visitor present Nurse Communication: Mobility status PT Visit Diagnosis: Unsteadiness on feet (R26.81);Muscle weakness (generalized) (M62.81);Pain Pain - Right/Left: Right Pain - part of body: Hip     Time: 2703-5009 PT Time Calculation (min) (ACUTE ONLY): 19 min  Charges:  $Gait Training: 8-22 mins                     11:30 AM, 03/20/22 Dylynn Ketner A. Saverio Danker PT, DPT Physical Therapist - [facility]    Chante Mayson A Kenadi Miltner 03/20/2022, 11:29 AM

## 2022-03-20 NOTE — Discharge Summary (Signed)
Physician Discharge Summary   Patient: Angela Trevino MRN: [id number] DOB: 01-02-1927  Admit date:     03/15/2022  Discharge date: 03/20/22  Discharge Physician: Annita Brod   PCP: [place] Pink, MD   Recommendations at discharge:   New medication: Tylenol 325, 1-2 tabs p.o. every 6 hours as needed for mild pain Patient being discharged to skilled nursing New medication: Dulcolax suppository 10 mg daily as needed for moderate constipation New medication: Colace 100 mg p.o. twice daily x1 month New medication: Lovenox 40 mg subcu daily x1 month New medication: Ensure p.o. twice daily between meals New medication: Vicodin 5/325 1-2 tabs every 6 hours as needed for moderate pain New medication: Multivitamin p.o. daily New medication: Trazodone 25 mg p.o. nightly as needed for sleep  Discharge Diagnoses: Principal Problem:   Closed right hip fracture ([place]) Active Problems:   Hypokalemia   Essential hypertension   Dyslipidemia  Resolved Problems:   * No resolved hospital problems. *  Hospital Course: 88 year old female with past medical history of hypertension and hyperlipidemia who presented to the emergency room after a mechanical fall on 9/8.  Emergency department, patient found to have a blood pressure of 174/77, potassium 3.2 and right hip and pelvic x-rays noted an acute comminuted and displaced proximal femoral fracture involving intertrochanteric region and subtrochanteric shaft.  Patient was seen by orthopedic surgery and underwent an ORIF on 9/9.  Postop, patient had issues with constipation secondary to pain medication and finally had bowel movement on 9/12.  She has been started on Lovenox for DVT prophylaxis.  Assessment and Plan: Closed right hip fracture: secondary to mechanical fall. S/p reduction & internal fixation of displaced reverse obliquity intertrochanteric right hip fracture on 03/16/22 as per ortho surg. Ortho surg recs apprec. PT/OT recs SNF.  Patient  accepted and felt to be stable for 9/13.  Lovenox for DVT prophylaxis.  Vicodin for moderate pain.  Follow-up with orthopedics as outpatient.  Opioid-induced constipation: Resolved with Colace and Dulcolax suppository with large bowel movement on 9/12.   Normocytic anemia: w/ component of acute blood loss anemia secondary to recent surgery. \Remained stable.  Hemoglobin at 8.0 on day of discharge.   Dizziness: hx of this in the past and took meclizine. Meclizine prn   Hypokalemia: Stay.  Potassium of 3.2 on admission, replaced.  Since then, potassium has been within normal limits.   HLD: continue on statin    HTN: continue on amlodipine    Thrombocytopenia: etiology unclear, labile.  Improved and by day of discharge at 166.     Pain control - [employer] Controlled Substance Reporting System database was reviewed. and patient was instructed, not to drive, operate heavy machinery, perform activities at heights, swimming or participation in water activities or provide baby-sitting services while on Pain, Sleep and Anxiety Medications; until their outpatient Physician has advised to do so again. Also recommended to not to take more than prescribed Pain, Sleep and Anxiety Medications.  Consultants: Orthopedic surgery Procedures performed: Status post ORIF 9/9 Disposition: Skilled nursing Diet recommendation:  Discharge Diet Orders (From admission, onward)     Start     Ordered   03/20/22 0000  Diet general        03/20/22 1150           Regular diet with Ensure supplements shake twice daily between meals DISCHARGE MEDICATION: Allergies as of 03/20/2022       Reactions   Lovastatin    Other reaction(s): Unknown  Rosuvastatin    Other reaction(s): Unknown   Simvastatin    Other reaction(s): Unknown        Medication List     STOP taking these medications    ammonium lactate 12 % cream Commonly known as: AMLACTIN   meclizine 12.5 MG tablet Commonly known as:  ANTIVERT       TAKE these medications    acetaminophen 325 MG tablet Commonly known as: TYLENOL Take 1-2 tablets (325-650 mg total) by mouth every 6 (six) hours as needed for mild pain (pain score 1-3 or temp > 100.5).   amLODipine 5 MG tablet Commonly known as: NORVASC Take 5 mg by mouth daily.   bisacodyl 10 MG suppository Commonly known as: DULCOLAX Place 1 suppository (10 mg total) rectally daily as needed for moderate constipation.   docusate sodium 100 MG capsule Commonly known as: COLACE Take 2 capsules (200 mg total) by mouth 2 (two) times daily.   enoxaparin 40 MG/0.4ML injection Commonly known as: LOVENOX Inject 0.4 mLs (40 mg total) into the skin daily.   feeding supplement Liqd Take 237 mLs by mouth 2 (two) times daily between meals.   gabapentin 100 MG capsule Commonly known as: NEURONTIN Take 200 mg by mouth at bedtime.   HYDROcodone-acetaminophen 5-325 MG tablet Commonly known as: NORCO/VICODIN Take 1-2 tablets by mouth every 6 (six) hours as needed for moderate pain.   multivitamin with minerals Tabs tablet Take 1 tablet by mouth daily. Start taking on: March 21, 2022   pravastatin 40 MG tablet Commonly known as: PRAVACHOL Take 40 mg by mouth daily.   traZODone 50 MG tablet Commonly known as: DESYREL Take 0.5 tablets (25 mg total) by mouth at bedtime as needed for sleep.   triamcinolone cream 0.1 % Commonly known as: KENALOG Apply 1 application topically 2 (two) times daily as needed (Rash). Apply to shoulders and back Avoid applying to face, groin, and axilla. Use as directed. Risk of skin atrophy with long-term use reviewed.               Discharge Care Instructions  (From admission, onward)           Start     Ordered   03/20/22 0000  Discharge wound care:       Comments: Reinforce dressing until follow-up with orthopedic surgery as outpatient   03/20/22 1150            Contact information for follow-up providers      Lattie Corns, PA-C Follow up in 6 week(s).   Specialty: Physician Assistant Why: Staple removal by SNF on 03/30/22 Follow-up in six weeks for x-rays of the right femur. Contact information: [place] [address] [phone]              Contact information for after-discharge care     Destination     [facility] .   Service: Skilled Nursing Contact information: [place] [phone]                    Discharge Exam: Danley Danker Weights   03/15/22 1913  Weight: 59.9 kg   General: Alert and oriented x3, no acute distress Cardiovascular: Regular rate and rhythm, S1-S2 Lungs: Clear to auscultation bilaterally  Condition at discharge: good  The results of significant diagnostics from this hospitalization (including imaging, microbiology, ancillary and laboratory) are listed below for reference.   Imaging Studies: DG HIP  UNILAT WITH PELVIS 2-3 VIEWS RIGHT  Result Date: 03/16/2022 CLINICAL DATA:  Fluoroscopic assistance for internal fixation of fracture of right femur EXAM: DG HIP (WITH OR WITHOUT PELVIS) 2-3V RIGHT COMPARISON:  03/15/2022 FINDINGS: Fluoroscopic images show reduction and internal fixation of comminuted intertrochanteric fracture extending to the proximal shaft with intramedullary rod. Fluoroscopy time 54 seconds. Radiation dose 3.03 mGy. IMPRESSION: Fluoroscopic assistance was provided for reduction and internal fixation of comminuted fracture of proximal right femur. Electronically Signed   By: Elmer Picker M.D.   On: 03/16/2022 11:47   DG C-Arm 1-60 Min-No Report  Result Date: 03/16/2022 Fluoroscopy was utilized by the requesting physician.  No radiographic interpretation.   DG Chest Portable 1 View  Result Date: 03/15/2022 CLINICAL DATA:  Preop hip fracture EXAM: PORTABLE CHEST 1 VIEW COMPARISON:  None Available. FINDINGS: The heart size and  mediastinal contours are within normal limits. Both lungs are clear. The visualized skeletal structures are unremarkable. IMPRESSION: No active disease. Electronically Signed   By: Donavan Foil M.D.   On: 03/15/2022 18:29   DG HIP UNILAT W OR W/O PELVIS 2-3 VIEWS RIGHT  Result Date: 03/15/2022 CLINICAL DATA:  Hip fracture EXAM: DG HIP (WITH OR WITHOUT PELVIS) 2-3V RIGHT COMPARISON:  None Available. FINDINGS: SI joints are non widened. Pubic symphysis and rami appear intact. Acute comminuted and displaced intertrochanteric fracture with involvement of the subtrochanteric shaft of the femur. Mild apex lateral angulation. No femoral head dislocation IMPRESSION: Acute comminuted and displaced proximal femoral fracture involves the intertrochanteric region and subtrochanteric shaft Electronically Signed   By: Donavan Foil M.D.   On: 03/15/2022 18:28   MM 3D SCREEN BREAST BILATERAL  Result Date: 03/15/2022 CLINICAL DATA:  Screening. EXAM: DIGITAL SCREENING BILATERAL MAMMOGRAM WITH TOMOSYNTHESIS AND CAD TECHNIQUE: Bilateral screening digital craniocaudal and mediolateral oblique mammograms were obtained. Bilateral screening digital breast tomosynthesis was performed. The images were evaluated with computer-aided detection. COMPARISON:  Previous exam(s). ACR Breast Density Category c: The breast tissue is heterogeneously dense, which may obscure small masses. FINDINGS: There are no findings suspicious for malignancy. IMPRESSION: No mammographic evidence of malignancy. A result letter of this screening mammogram will be mailed directly to the patient. RECOMMENDATION: Screening mammogram in one year. (Code:[id number]-B-01Y) BI-RADS CATEGORY  1: Negative. Electronically Signed   By: Margarette Canada M.D.   On: 03/15/2022 13:04    Microbiology: Results for orders placed or performed during the hospital encounter of 03/15/22  SARS Coronavirus 2 by RT PCR (hospital order, performed in [facility] hospital lab) *cepheid single  result test* Anterior Nasal Swab     Status: None   Collection Time: 03/15/22  5:38 PM   Specimen: Anterior Nasal Swab  Result Value Ref Range Status   SARS Coronavirus 2 by RT PCR NEGATIVE NEGATIVE Final    Comment: (NOTE) SARS-CoV-2 target nucleic acids are NOT DETECTED.  The SARS-CoV-2 RNA is generally detectable in upper and lower respiratory specimens during the acute phase of infection. The lowest concentration of SARS-CoV-2 viral copies this assay can detect is 250 copies / mL. A negative result does not preclude SARS-CoV-2 infection and should not be used as the sole basis for treatment or other patient management decisions.  A negative result may occur with improper specimen collection / handling, submission of specimen other than nasopharyngeal swab, presence of viral mutation(s) within the areas targeted by this assay, and inadequate number of viral copies (<250 copies / mL). A negative result must be combined with clinical observations, patient  history, and epidemiological information.  Fact Sheet for Patients:   https://www.patel.info/  Fact Sheet for Healthcare Providers: https://hall.com/  This test is not yet approved or  cleared by the [place] FDA and has been authorized for detection and/or diagnosis of SARS-CoV-2 by FDA under an Emergency Use Authorization (EUA).  This EUA will remain in effect (meaning this test can be used) for the duration of the COVID-19 declaration under Section 564(b)(1) of the Act, 21 U.S.C. section 360bbb-3(b)(1), unless the authorization is terminated or revoked sooner.  Performed at [facility], [address], [place], [place] [address]   Surgical PCR screen     Status: None   Collection Time: 03/15/22 11:10 PM   Specimen: Nasal Mucosa; Nasal Swab  Result Value Ref Range Status   MRSA, PCR NEGATIVE NEGATIVE Final   Staphylococcus aureus NEGATIVE NEGATIVE Final     Comment: (NOTE) The Xpert SA Assay (FDA approved for NASAL specimens in patients 26 years of age and older), is one component of a comprehensive surveillance program. It is not intended to diagnose infection nor to guide or monitor treatment. Performed at [facility], [address] [place], [place] [address]     Labs: CBC: Recent Labs  Lab 03/16/22 0751 03/17/22 0719 03/18/22 0620 03/19/22 0604 03/20/22 0526  WBC 7.1 6.7 5.8 5.0 4.1  HGB 10.7* 9.1* 8.8* 8.5* 8.0*  HCT 32.5* 27.4* 26.9* 26.0* 24.7*  MCV 95.6 93.5 94.1 95.9 95.4  PLT 155 136* 135* 136* 528   Basic Metabolic Panel: Recent Labs  Lab 03/16/22 0751 03/17/22 0719 03/18/22 0620 03/19/22 0604 03/20/22 0526  NA 137 143 141 140 139  K 4.1 4.2 3.9 3.9 3.9  CL 104 111 108 104 105  CO2 '26 28 28 29 30  '$ GLUCOSE 131* 133* 128* 142* 121*  BUN '11 13 17 16 15  '$ CREATININE 0.62 0.70 0.65 0.53 0.66  CALCIUM 8.2* 8.7* 8.3* 8.3* 8.4*   Liver Function Tests: Recent Labs  Lab 03/15/22 1738  AST 24  ALT 22  ALKPHOS 77  BILITOT 0.7  PROT 6.7  ALBUMIN 4.3   CBG: No results for input(s): "GLUCAP" in the last 168 hours.  Discharge time spent: less than 30 minutes.  Signed: Annita Brod, MD [facility] 03/20/2022

## 2022-03-21 ENCOUNTER — Other Ambulatory Visit: Payer: Self-pay | Admitting: Internal Medicine

## 2022-03-21 MED ORDER — HYDROCODONE-ACETAMINOPHEN 5-325 MG PO TABS: 1.0000 | ORAL_TABLET | Freq: Four times a day (QID) | ORAL | 0 refills | Status: AC | PRN

## 2022-03-21 NOTE — Progress Notes (Signed)
In rehab at [facility]

## 2022-03-22 ENCOUNTER — Non-Acute Institutional Stay (SKILLED_NURSING_FACILITY): Payer: Medicare Other | Admitting: Student

## 2022-03-22 ENCOUNTER — Encounter: Payer: Self-pay | Admitting: Student

## 2022-03-22 DIAGNOSIS — E876 Hypokalemia: Secondary | ICD-10-CM

## 2022-03-22 DIAGNOSIS — S72001D Fracture of unspecified part of neck of right femur, subsequent encounter for closed fracture with routine healing: Secondary | ICD-10-CM | POA: Diagnosis not present

## 2022-03-22 DIAGNOSIS — R110 Nausea: Secondary | ICD-10-CM

## 2022-03-22 DIAGNOSIS — E785 Hyperlipidemia, unspecified: Secondary | ICD-10-CM | POA: Diagnosis not present

## 2022-03-22 DIAGNOSIS — I10 Essential (primary) hypertension: Secondary | ICD-10-CM

## 2022-03-22 DIAGNOSIS — R42 Dizziness and giddiness: Secondary | ICD-10-CM

## 2022-03-22 DIAGNOSIS — K5901 Slow transit constipation: Secondary | ICD-10-CM

## 2022-03-22 NOTE — Progress Notes (Signed)
Provider:  Dewayne Shorter, MD    Location:   [place] Room Number: 818 Place of Service:  SNF (31)  PCP: Maryland Pink, MD Patient Care Team: Maryland Pink, MD as PCP - General (Family Medicine)  Extended Emergency Contact Information Primary Emergency Contact: Southwood,Chris Mobile Phone: [phone] Relation: Son Secondary Emergency Contact: Staci Acosta Address: [address], [place] [address] Home Phone: [phone] Work Phone: [phone] Mobile Phone: [phone] Relation: Other  Code Status: Full Code Goals of Care: Advanced Directive information    03/22/2022   11:08 AM  Advanced Directives  Does Patient Have a Medical Advance Directive? No      Chief Complaint  Patient presents with   New Admit To SNF    New admit to SNF    HPI: Patient is a 88 y.o. female seen today for admission to healthcare center after hospitlaization for a ground-level fall. During hospitalization she had a closed fixation and had no complications during her hospitalization other than postoperative anemia.   She dropped her cane and bent over to pick it up and fell to the ground. She has a history of vertigo and she takers antivert  She doesn't have dizziness every time she gets up, but it is sporadic. At home, she has a walking cane and at night uses a walker. She lives between [place] and [place]. Her two sons live within 100 yards from her. When she fell, he was sitting on his back porch and he heard her holler. And he was there within 15 minutes of the fall. She takes care of her meals and her son takes her to and from the grocery store. She does her bills, and support from her sons. She cooks and cleans her home. She didn't have an issues with delirium in the hospital. Tells me her full name and date of birth, she is oriented to Friday, March 22, 2022. No memory concerns and remains independent.   She drives 1/2 a mile to  her church and back home, but will likely start having her sons take her to and from.   She had another fall this year at her church, but that was without injury ~8 weeks ago.   Her son Gerald Stabs is at bedside and agrees with the plan.   Past Medical History:  Diagnosis Date   Actinic keratosis    Basal cell carcinoma 05/17/2019   R post neck   Melanoma ([place]) 05/31/2019   left lat knee BRESLOW'S DEPTH/MAXIMUM TUMOR THICKNESS: 0.7 MM. Excised 07/05/2019, margins free.   Past Surgical History:  Procedure Laterality Date   INTRAMEDULLARY (IM) NAIL INTERTROCHANTERIC Right 03/16/2022   Procedure: INTRAMEDULLARY (IM) NAIL INTERTROCHANTERIC;  Surgeon: Corky Mull, MD;  Location: [facility] ORS;  Service: Orthopedics;  Laterality: Right;    reports that she has never smoked. She has never used smokeless tobacco. No history on file for alcohol use and drug use. Social History   Socioeconomic History   Marital status: Widowed    Spouse name: Not on file   Number of children: Not on file   Years of education: Not on file   Highest education level: Not on file  Occupational History   Not on file  Tobacco Use   Smoking status: Never   Smokeless tobacco: Never  Substance and Sexual Activity   Alcohol use: Not on file   Drug use: Not on file   Sexual activity:  Not on file  Other Topics Concern   Not on file  Social History Narrative   Not on file   Social Determinants of Health   Financial Resource Strain: Not on file  Food Insecurity: No Food Insecurity (03/16/2022)   Hunger Vital Sign    Worried About Running Out of Food in the Last Year: Never true    Ran Out of Food in the Last Year: Never true  Transportation Needs: No Transportation Needs (03/16/2022)   PRAPARE - Hydrologist (Medical): No    Lack of Transportation (Non-Medical): No  Physical Activity: Not on file  Stress: Not on file  Social Connections: Not on file  Intimate Partner Violence: Not At Risk  (03/16/2022)   Humiliation, Afraid, Rape, and Kick questionnaire    Fear of Current or Ex-Partner: No    Emotionally Abused: No    Physically Abused: No    Sexually Abused: No    Functional Status Survey:    Family History  Problem Relation Age of Onset   Breast cancer Neg Hx     Health Maintenance  Topic Date Due   TETANUS/TDAP  Never done   Zoster Vaccines- Shingrix (1 of 2) Never done   Pneumonia Vaccine 96+ Years old (1 - PCV) Never done   DEXA SCAN  Never done   COVID-19 Vaccine (5 - Pfizer risk series) 10/08/2019   INFLUENZA VACCINE  Completed   HPV VACCINES  Aged Out    Allergies  Allergen Reactions   Lovastatin     Other reaction(s): Unknown   Rosuvastatin     Other reaction(s): Unknown   Simvastatin     Other reaction(s): Unknown    Allergies as of 03/22/2022       Reactions   Lovastatin    Other reaction(s): Unknown   Rosuvastatin    Other reaction(s): Unknown   Simvastatin    Other reaction(s): Unknown        Medication List        Accurate as of March 22, 2022  2:36 PM. If you have any questions, ask your nurse or doctor.          STOP taking these medications    feeding supplement Liqd Stopped by: Dewayne Shorter, MD       TAKE these medications    acetaminophen 325 MG tablet Commonly known as: TYLENOL Take 1-2 tablets (325-650 mg total) by mouth every 6 (six) hours as needed for mild pain (pain score 1-3 or temp > 100.5).   amLODipine 5 MG tablet Commonly known as: NORVASC Take 5 mg by mouth daily.   bisacodyl 10 MG suppository Commonly known as: DULCOLAX Place 1 suppository (10 mg total) rectally daily as needed for moderate constipation.   docusate sodium 100 MG capsule Commonly known as: COLACE Take 2 capsules (200 mg total) by mouth 2 (two) times daily.   enoxaparin 40 MG/0.4ML injection Commonly known as: LOVENOX Inject 0.4 mLs (40 mg total) into the skin daily.   gabapentin 100 MG capsule Commonly known  as: NEURONTIN Take 2 capsules (200 mg total) by mouth at bedtime.   HYDROcodone-acetaminophen 5-325 MG tablet Commonly known as: NORCO/VICODIN Take 1-2 tablets by mouth every 6 (six) hours as needed for moderate pain. What changed: how much to take   multivitamin with minerals Tabs tablet Take 1 tablet by mouth daily.   ondansetron 4 MG tablet Commonly known as: ZOFRAN Take 4 mg by mouth every 8 (eight)  hours as needed for nausea or vomiting.   pravastatin 40 MG tablet Commonly known as: PRAVACHOL Take 40 mg by mouth daily.   traZODone 50 MG tablet Commonly known as: DESYREL Take 0.5 tablets (25 mg total) by mouth at bedtime as needed for sleep.   triamcinolone cream 0.1 % Commonly known as: KENALOG Apply 1 application topically 2 (two) times daily as needed (Rash). Apply to shoulders and back Avoid applying to face, groin, and axilla. Use as directed. Risk of skin atrophy with long-term use reviewed.        Review of Systems  Constitutional:  Positive for activity change.  Respiratory:  Negative for choking and shortness of breath.   Cardiovascular:  Negative for chest pain.  Gastrointestinal:  Positive for nausea.  Genitourinary:  Negative for difficulty urinating and dysuria.    Vitals:   03/22/22 1059  BP: (!) 148/62  Pulse: 64  Resp: 20  Temp: 97.6 F (36.4 C)  SpO2: 95%  Weight: 133 lb 6.4 oz (60.5 kg)   Body mass index is 22.9 kg/m. Physical Exam Constitutional:      Appearance: Normal appearance.  Cardiovascular:     Rate and Rhythm: Normal rate and regular rhythm.     Pulses: Normal pulses.     Comments: 3/6 systolic murmur Pulmonary:     Effort: Pulmonary effort is normal.     Breath sounds: Normal breath sounds.  Abdominal:     General: Abdomen is flat. Bowel sounds are normal.     Palpations: Abdomen is soft.  Musculoskeletal:     Comments: R leg with bruising and 2 incisions clean/dry/intact s/p femoral rod placement.   Neurological:      Mental Status: She is alert.  Psychiatric:        Mood and Affect: Mood normal.     Comments: Slightly anxious when discussing code status     Labs reviewed: Basic Metabolic Panel: Recent Labs    03/18/22 0620 03/19/22 0604 03/20/22 0526  NA 141 140 139  K 3.9 3.9 3.9  CL 108 104 105  CO2 '28 29 30  '$ GLUCOSE 128* 142* 121*  BUN '17 16 15  '$ CREATININE 0.65 0.53 0.66  CALCIUM 8.3* 8.3* 8.4*   Liver Function Tests: Recent Labs    03/15/22 1738  AST 24  ALT 22  ALKPHOS 77  BILITOT 0.7  PROT 6.7  ALBUMIN 4.3   No results for input(s): "LIPASE", "AMYLASE" in the last 8760 hours. No results for input(s): "AMMONIA" in the last 8760 hours. CBC: Recent Labs    03/18/22 0620 03/19/22 0604 03/20/22 0526  WBC 5.8 5.0 4.1  HGB 8.8* 8.5* 8.0*  HCT 26.9* 26.0* 24.7*  MCV 94.1 95.9 95.4  PLT 135* 136* 166   Cardiac Enzymes: No results for input(s): "CKTOTAL", "CKMB", "CKMBINDEX", "TROPONINI" in the last 8760 hours. BNP: Invalid input(s): "POCBNP" No results found for: "HGBA1C" No results found for: "TSH" No results found for: "VITAMINB12" No results found for: "FOLATE" No results found for: "IRON", "TIBC", "FERRITIN"  Imaging and Procedures obtained prior to SNF admission: DG HIP UNILAT WITH PELVIS 2-3 VIEWS RIGHT  Result Date: 03/16/2022 CLINICAL DATA:  Fluoroscopic assistance for internal fixation of fracture of right femur EXAM: DG HIP (WITH OR WITHOUT PELVIS) 2-3V RIGHT COMPARISON:  03/15/2022 FINDINGS: Fluoroscopic images show reduction and internal fixation of comminuted intertrochanteric fracture extending to the proximal shaft with intramedullary rod. Fluoroscopy time 54 seconds. Radiation dose 3.03 mGy. IMPRESSION: Fluoroscopic assistance was provided for  reduction and internal fixation of comminuted fracture of proximal right femur. Electronically Signed   By: Elmer Picker M.D.   On: 03/16/2022 11:47   DG C-Arm 1-60 Min-No Report  Result Date:  03/16/2022 Fluoroscopy was utilized by the requesting physician.  No radiographic interpretation.   DG Chest Portable 1 View  Result Date: 03/15/2022 CLINICAL DATA:  Preop hip fracture EXAM: PORTABLE CHEST 1 VIEW COMPARISON:  None Available. FINDINGS: The heart size and mediastinal contours are within normal limits. Both lungs are clear. The visualized skeletal structures are unremarkable. IMPRESSION: No active disease. Electronically Signed   By: Donavan Foil M.D.   On: 03/15/2022 18:29   DG HIP UNILAT W OR W/O PELVIS 2-3 VIEWS RIGHT  Result Date: 03/15/2022 CLINICAL DATA:  Hip fracture EXAM: DG HIP (WITH OR WITHOUT PELVIS) 2-3V RIGHT COMPARISON:  None Available. FINDINGS: SI joints are non widened. Pubic symphysis and rami appear intact. Acute comminuted and displaced intertrochanteric fracture with involvement of the subtrochanteric shaft of the femur. Mild apex lateral angulation. No femoral head dislocation IMPRESSION: Acute comminuted and displaced proximal femoral fracture involves the intertrochanteric region and subtrochanteric shaft Electronically Signed   By: Donavan Foil M.D.   On: 03/15/2022 18:28    Assessment/Plan 1. Closed fracture of right hip with routine healing, subsequent encounter Patient's pain is well-controlled with rest. She would like better control of pain for therapy. She has not asked for medicatoins consistently. Plan to schedule tylenol 650 TID, and hydrocodone-acetamenophen 5-325 for moderate or severe pain. Will continue physical therapy with a goal of return home. Continue Vitamin D supplementation. Discussed bisphosphonate therapy which is deferred at this time. She has had some nausea and would prefer not to start a new medication. Will continue Lovenox for anticoagulation. Patient has had some issues sleeping due to adjustment of new location, will continue trazodone 25 mg nightly while in skilled nursing.   2. Hypokalemia Potassium low on the day of admission,  however, remained normal range throughout hospitalization. Will defer treatment and will monitor as needed.   3. Dyslipidemia Patient is on Atorvastatin 40 mg daily for hyperlipidemia will continue this medication  4. Essential hypertension BP well-controlled on current regimen. Discussed concern for making adjustment based on one outlier of an elevated BP. Will continue amlodipine 5 mg daily for hypertension. Continue to monitor for need of increase.   5. Nausea Patient has had some nausea after discharge  however she is tolerating a full diet. Plan to keep ondansetron 4 mg q8hr PRN available.   6. Constipation: Patient had some issues with postoperative constipation. Having regular bowel movements with colace. Plan to continue. Dulculax suppository available prn.   7. Dizziness Patient had an issue with dizziness which led to her fall. Was treated with meclizine in the hospital and not continuing during stay at skilled nursing. Some concern that this is due to side effect of gabapentin. Patient has adequate hydration and a full diet. Plan to d/c gabapentin 200 mg nightly. Continue to monitor for symptoms.   Family/ staff Communication: Luvenia Redden and Nursing staff.   Labs/tests ordered: CBC, Ferritin, Iron panel   Tomasa Rand, MD, [facility] [phone]

## 2022-03-29 ENCOUNTER — Telehealth: Payer: Self-pay | Admitting: Student

## 2022-03-29 NOTE — Telephone Encounter (Signed)
Spoke with patient's son Octavia Bruckner who has concerns that patient is not ready to return home at this time, however insurance states they will call on Monday for patient to be discharged on Wednesday.  They are seeking to appeal and have been in contact with social work.  We will continue to rely on physical therapy progress before recommending time for discharge.  Tomasa Rand, MD, [facility] [phone]

## 2022-06-03 ENCOUNTER — Ambulatory Visit: Payer: Medicare Other | Admitting: Dermatology

## 2022-07-03 ENCOUNTER — Ambulatory Visit: Payer: Medicare Other | Admitting: Dermatology

## 2022-07-03 DIAGNOSIS — L570 Actinic keratosis: Secondary | ICD-10-CM | POA: Diagnosis not present

## 2022-07-03 DIAGNOSIS — Z1283 Encounter for screening for malignant neoplasm of skin: Secondary | ICD-10-CM | POA: Diagnosis not present

## 2022-07-03 DIAGNOSIS — C4492 Squamous cell carcinoma of skin, unspecified: Secondary | ICD-10-CM

## 2022-07-03 DIAGNOSIS — D2261 Melanocytic nevi of right upper limb, including shoulder: Secondary | ICD-10-CM

## 2022-07-03 DIAGNOSIS — D229 Melanocytic nevi, unspecified: Secondary | ICD-10-CM

## 2022-07-03 DIAGNOSIS — L821 Other seborrheic keratosis: Secondary | ICD-10-CM

## 2022-07-03 DIAGNOSIS — L814 Other melanin hyperpigmentation: Secondary | ICD-10-CM

## 2022-07-03 DIAGNOSIS — C44629 Squamous cell carcinoma of skin of left upper limb, including shoulder: Secondary | ICD-10-CM | POA: Diagnosis not present

## 2022-07-03 DIAGNOSIS — L565 Disseminated superficial actinic porokeratosis (DSAP): Secondary | ICD-10-CM | POA: Diagnosis not present

## 2022-07-03 DIAGNOSIS — D485 Neoplasm of uncertain behavior of skin: Secondary | ICD-10-CM

## 2022-07-03 DIAGNOSIS — Z85828 Personal history of other malignant neoplasm of skin: Secondary | ICD-10-CM

## 2022-07-03 DIAGNOSIS — L578 Other skin changes due to chronic exposure to nonionizing radiation: Secondary | ICD-10-CM

## 2022-07-03 DIAGNOSIS — Z85820 Personal history of malignant melanoma of skin: Secondary | ICD-10-CM

## 2022-07-03 HISTORY — DX: Squamous cell carcinoma of skin, unspecified: C44.92

## 2022-07-03 NOTE — Patient Instructions (Signed)
Recommend starting moisturizer with exfoliant (Urea, Salicylic acid, or Lactic acid) one to two times daily to help smooth rough and bumpy skin.  OTC options include Cetaphil Rough and Bumpy lotion (Urea), Eucerin Roughness Relief lotion or spot treatment cream (Urea), CeraVe SA lotion/cream for Rough and Bumpy skin (Sal Acid), Gold Bond Rough and Bumpy cream (Sal Acid), and AmLactin 12% lotion/cream (Lactic Acid).  If applying in morning, also apply sunscreen to sun-exposed areas, since these exfoliating moisturizers can increase sensitivity to sun.   Wound Care Instructions  Cleanse wound gently with soap and water once a day then pat dry with clean gauze. Apply a thin coat of Petrolatum (petroleum jelly, "Vaseline") over the wound (unless you have an allergy to this). We recommend that you use a new, sterile tube of Vaseline. Do not pick or remove scabs. Do not remove the yellow or white "healing tissue" from the base of the wound.  Cover the wound with fresh, clean, nonstick gauze and secure with paper tape. You may use Band-Aids in place of gauze and tape if the wound is small enough, but would recommend trimming much of the tape off as there is often too much. Sometimes Band-Aids can irritate the skin.  You should call the office for your biopsy report after 1 week if you have not already been contacted.  If you experience any problems, such as abnormal amounts of bleeding, swelling, significant bruising, significant pain, or evidence of infection, please call the office immediately.  FOR ADULT SURGERY PATIENTS: If you need something for pain relief you may take 1 extra strength Tylenol (acetaminophen) AND 2 Ibuprofen (200mg each) together every 4 hours as needed for pain. (do not take these if you are allergic to them or if you have a reason you should not take them.) Typically, you may only need pain medication for 1 to 3 days.     Due to recent changes in healthcare laws, you may see  results of your pathology and/or laboratory studies on MyChart before the doctors have had a chance to review them. We understand that in some cases there may be results that are confusing or concerning to you. Please understand that not all results are received at the same time and often the doctors may need to interpret multiple results in order to provide you with the best plan of care or course of treatment. Therefore, we ask that you please give us 2 business days to thoroughly review all your results before contacting the office for clarification. Should we see a critical lab result, you will be contacted sooner.   If You Need Anything After Your Visit  If you have any questions or concerns for your doctor, please call our main line at [phone] and press option 4 to reach your doctor's medical assistant. If no one answers, please leave a voicemail as directed and we will return your call as soon as possible. Messages left after 4 pm will be answered the following business day.   You may also send us a message via MyChart. We typically respond to MyChart messages within 1-2 business days.  For prescription refills, please ask your pharmacy to contact our office. Our fax number is [phone].  If you have an urgent issue when the clinic is closed that cannot wait until the next business day, you can page your doctor at the number below.    Please note that while we do our best to be available for urgent issues outside of   office hours, we are not available 24/7.   If you have an urgent issue and are unable to reach us, you may choose to seek medical care at your doctor's office, retail clinic, urgent care center, or emergency room.  If you have a medical emergency, please immediately call 911 or go to the emergency department.  Pager Numbers  - Dr. Kowalski: [phone]  - Dr. Moye: [phone]  - Dr. Stewart: [phone]  In the event of inclement weather, please call our main  line at [phone] for an update on the status of any delays or closures.  Dermatology Medication Tips: Please keep the boxes that topical medications come in in order to help keep track of the instructions about where and how to use these. Pharmacies typically print the medication instructions only on the boxes and not directly on the medication tubes.   If your medication is too expensive, please contact our office at [phone] option 4 or send us a message through MyChart.   We are unable to tell what your co-pay for medications will be in advance as this is different depending on your insurance coverage. However, we may be able to find a substitute medication at lower cost or fill out paperwork to get insurance to cover a needed medication.   If a prior authorization is required to get your medication covered by your insurance company, please allow us 1-2 business days to complete this process.  Drug prices often vary depending on where the prescription is filled and some pharmacies may offer cheaper prices.  The website www.goodrx.com contains coupons for medications through different pharmacies. The prices here do not account for what the cost may be with help from insurance (it may be cheaper with your insurance), but the website can give you the price if you did not use any insurance.  - You can print the associated coupon and take it with your prescription to the pharmacy.  - You may also stop by our office during regular business hours and pick up a GoodRx coupon card.  - If you need your prescription sent electronically to a different pharmacy, notify our office through [facility] MyChart or by phone at [phone] option 4.     Si Usted Necesita Algo Despus de Su Visita  Tambin puede enviarnos un mensaje a travs de MyChart. Por lo general respondemos a los mensajes de MyChart en el transcurso de 1 a 2 das hbiles.  Para renovar recetas, por favor pida a su farmacia que  se ponga en contacto con nuestra oficina. Nuestro nmero de fax es el [phone].  Si tiene un asunto urgente cuando la clnica est cerrada y que no puede esperar hasta el siguiente da hbil, puede llamar/localizar a su doctor(a) al nmero que aparece a continuacin.   Por favor, tenga en cuenta que aunque hacemos todo lo posible para estar disponibles para asuntos urgentes fuera del horario de oficina, no estamos disponibles las 24 horas del da, los 7 das de la semana.   Si tiene un problema urgente y no puede comunicarse con nosotros, puede optar por buscar atencin mdica  en el consultorio de su doctor(a), en una clnica privada, en un centro de atencin urgente o en una sala de emergencias.  Si tiene una emergencia mdica, por favor llame inmediatamente al 911 o vaya a la sala de emergencias.  Nmeros de bper  - Dr. Kowalski: [phone]  - Dra. Moye: [phone]  - Dra. Stewart: [phone]  En caso de inclemencias   del tiempo, por favor llame a nuestra lnea principal al [phone] para una actualizacin sobre el estado de cualquier retraso o cierre.  Consejos para la medicacin en dermatologa: Por favor, guarde las cajas en las que vienen los medicamentos de uso tpico para ayudarle a seguir las instrucciones sobre dnde y cmo usarlos. Las farmacias generalmente imprimen las instrucciones del medicamento slo en las cajas y no directamente en los tubos del medicamento.   Si su medicamento es muy caro, por favor, pngase en contacto con nuestra oficina llamando al [phone] y presione la opcin 4 o envenos un mensaje a travs de MyChart.   No podemos decirle cul ser su copago por los medicamentos por adelantado ya que esto es diferente dependiendo de la cobertura de su seguro. Sin embargo, es posible que podamos encontrar un medicamento sustituto a menor costo o llenar un formulario para que el seguro cubra el medicamento que se considera necesario.   Si se  requiere una autorizacin previa para que su compaa de seguros cubra su medicamento, por favor permtanos de 1 a 2 das hbiles para completar este proceso.  Los precios de los medicamentos varan con frecuencia dependiendo del lugar de dnde se surte la receta y alguna farmacias pueden ofrecer precios ms baratos.  El sitio web www.goodrx.com tiene cupones para medicamentos de diferentes farmacias. Los precios aqu no tienen en cuenta lo que podra costar con la ayuda del seguro (puede ser ms barato con su seguro), pero el sitio web puede darle el precio si no utiliz ningn seguro.  - Puede imprimir el cupn correspondiente y llevarlo con su receta a la farmacia.  - Tambin puede pasar por nuestra oficina durante el horario de atencin regular y recoger una tarjeta de cupones de GoodRx.  - Si necesita que su receta se enve electrnicamente a una farmacia diferente, informe a nuestra oficina a travs de MyChart de  o por telfono llamando al [phone] y presione la opcin 4.  

## 2022-07-03 NOTE — Progress Notes (Signed)
Follow-Up Visit   Subjective  Angela Trevino is a 88 y.o. female who presents for the following: TBSE (Patient with hx of melanoma, BCC. ).  The patient presents for Total-Body Skin Exam (TBSE) for skin cancer screening and mole check.  The patient has spots, moles and lesions to be evaluated, some may be new or changing and the patient has concerns that these could be cancer.   The following portions of the chart were reviewed this encounter and updated as appropriate:       Review of Systems:  No other skin or systemic complaints except as noted in HPI or Assessment and Plan.  Objective  Well appearing patient in no apparent distress; mood and affect are within normal limits.  A full examination was performed including scalp, head, eyes, ears, nose, lips, neck, chest, axillae, abdomen, back, buttocks, bilateral upper extremities, bilateral lower extremities, hands, feet, fingers, toes, fingernails, and toenails. All findings within normal limits unless otherwise noted below.  Right Upper Arm 7.0 mm flesh, smooth papule - present for years per pt   arms, legs Multiple pink brown, scaly macules with keratotic rims  Left Mid Forearm 9 mm firm pink nodule, tender to touch        right upper elbow x 1 Pink scaly papule    Assessment & Plan  Nevus Right Upper Arm  Benign-appearing. Stable compared to previous visit. Observation.  Call clinic for new or changing moles.  Recommend daily use of broad spectrum spf 30+ sunscreen to sun-exposed areas.    DSAP (disseminated superficial actinic porokeratosis) arms, legs  Chronic and persistent condition with duration or expected duration over one year. Not currently at goal.    DSAP is a chronic inherited condition of sun-exposed skin, most commonly affecting the arms and legs.  It is difficult to treat.  Recommend photoprotection and regular use of spf 30 or higher sunscreen to prevent worsening of condition and  precancerous changes.   Pt had used cholesterol/lovastatin cream and ammonium lactate 12% cream in the past, but says it didn't help.  Continue daily sunscreen when outdoors  Recommend starting moisturizer with exfoliant (Urea, Salicylic acid, or Lactic acid) one to two times daily to help smooth rough and bumpy skin.  OTC options include Cetaphil Rough and Bumpy lotion (Urea), Eucerin Roughness Relief lotion or spot treatment cream (Urea), CeraVe SA lotion/cream for Rough and Bumpy skin (Sal Acid), Gold Bond Rough and Bumpy cream (Sal Acid), and AmLactin 12% lotion/cream (Lactic Acid).  If applying in morning, also apply sunscreen to sun-exposed areas, since these exfoliating moisturizers can increase sensitivity to sun.   Neoplasm of uncertain behavior of skin Left Mid Forearm  Epidermal / dermal shaving  Lesion diameter (cm):  0.9 Informed consent: discussed and consent obtained   Patient was prepped and draped in usual sterile fashion: area prepped with alcohol. Anesthesia: the lesion was anesthetized in a standard fashion   Anesthetic:  1% lidocaine w/ epinephrine 1-100,000 buffered w/ 8.4% NaHCO3 Instrument used: flexible razor blade   Hemostasis achieved with: pressure, aluminum chloride and electrodesiccation   Outcome: patient tolerated procedure well    Destruction of lesion  Destruction method: electrodesiccation and curettage   Informed consent: discussed and consent obtained   Curettage performed in three different directions: Yes   Electrodesiccation performed over the curetted area: Yes   Final wound size (cm):  1.1 Hemostasis achieved with:  pressure, aluminum chloride and electrodesiccation Outcome: patient tolerated procedure well with no complications  Post-procedure details: sterile dressing applied and wound care instructions given   Dressing type: petrolatum and pressure dressing    Specimen 1 - Surgical pathology Differential Diagnosis: Inflamed Cyst r/o  SCC  Check Margins: No 9 mm firm pink nodule, tender to touch Treated with EDC   R/o SCC  AK (actinic keratosis) right upper elbow x 1  Hypertrophic  Actinic keratoses are precancerous spots that appear secondary to cumulative UV radiation exposure/sun exposure over time. They are chronic with expected duration over 1 year. A portion of actinic keratoses will progress to squamous cell carcinoma of the skin. It is not possible to reliably predict which spots will progress to skin cancer and so treatment is recommended to prevent development of skin cancer.  Recommend daily broad spectrum sunscreen SPF 30+ to sun-exposed areas, reapply every 2 hours as needed.  Recommend staying in the shade or wearing long sleeves, sun glasses (UVA+UVB protection) and wide brim hats (4-inch brim around the entire circumference of the hat). Call for new or changing lesions.    Destruction of lesion - right upper elbow x 1  Destruction method: cryotherapy   Informed consent: discussed and consent obtained   Lesion destroyed using liquid nitrogen: Yes   Region frozen until ice ball extended beyond lesion: Yes   Outcome: patient tolerated procedure well with no complications   Post-procedure details: wound care instructions given   Additional details:  Prior to procedure, discussed risks of blister formation, small wound, skin dyspigmentation, or rare scar following cryotherapy. Recommend Vaseline ointment to treated areas while healing.    History of Basal Cell Carcinoma of the Skin - No evidence of recurrence today - Recommend regular full body skin exams - Recommend daily broad spectrum sunscreen SPF 30+ to sun-exposed areas, reapply every 2 hours as needed.  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 Excised 07/05/2019, margins free.  - No evidence of recurrence today - No  lymphadenopathy - Recommend regular full body skin exams - Recommend daily broad spectrum sunscreen SPF 30+ to sun-exposed areas, reapply every 2 hours as needed.  - Call if any new or changing lesions are noted between office visits  Lentigines - Scattered tan macules - Due to sun exposure - Benign-appearing, observe - Recommend daily broad spectrum sunscreen SPF 30+ to sun-exposed areas, reapply every 2 hours as needed. - Call for any changes  Seborrheic Keratoses - Stuck-on, waxy, tan-brown papules and/or plaques  - Benign-appearing - Discussed benign etiology and prognosis. - Observe - Call for any changes  Melanocytic Nevi - Tan-brown and/or pink-flesh-colored symmetric macules and papules - Benign appearing on exam today - Observation - Call clinic for new or changing moles - Recommend daily use of broad spectrum spf 30+ sunscreen to sun-exposed areas.   Hemangiomas - Red papules - Discussed benign nature - Observe - Call for any changes  Actinic Damage - Chronic condition, secondary to cumulative UV/sun exposure - diffuse scaly erythematous macules with underlying dyspigmentation - Recommend daily broad spectrum sunscreen SPF 30+ to sun-exposed areas, reapply every 2 hours as needed.  - Staying in the shade or wearing long sleeves, sun glasses (UVA+UVB protection) and wide brim hats (4-inch brim around the entire circumference of the hat) are also recommended for sun protection.  - Call for new or changing lesions.  Skin cancer screening performed today.  Return in about 6 months (around  01/02/2023) for TBSE, Hx MM, Hx BCC, Hx AK.  Graciella Belton, RMA, am acting as scribe for [name], MD .  Documentation: I have reviewed the above documentation for accuracy and completeness, and I agree with the above.  [name] MD

## 2022-07-15 ENCOUNTER — Telehealth: Payer: Self-pay

## 2022-07-15 NOTE — Telephone Encounter (Signed)
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

## 2022-07-15 NOTE — Telephone Encounter (Signed)
Advised pt of bx results/sh ?

## 2022-11-07 IMAGING — MG MM DIGITAL SCREENING BILAT W/ TOMO AND CAD
8 series · 9 of 24 positions shown · non-contrast
Comparison: Previous exam(s).

CLINICAL DATA: Screening.

EXAM:
DIGITAL SCREENING BILATERAL MAMMOGRAM WITH TOMOSYNTHESIS AND CAD
TECHNIQUE: Bilateral screening digital craniocaudal and mediolateral oblique
mammograms were obtained. Bilateral screening digital breast
tomosynthesis was performed. The images were evaluated with
computer-aided detection.

[R CC synth-2D]
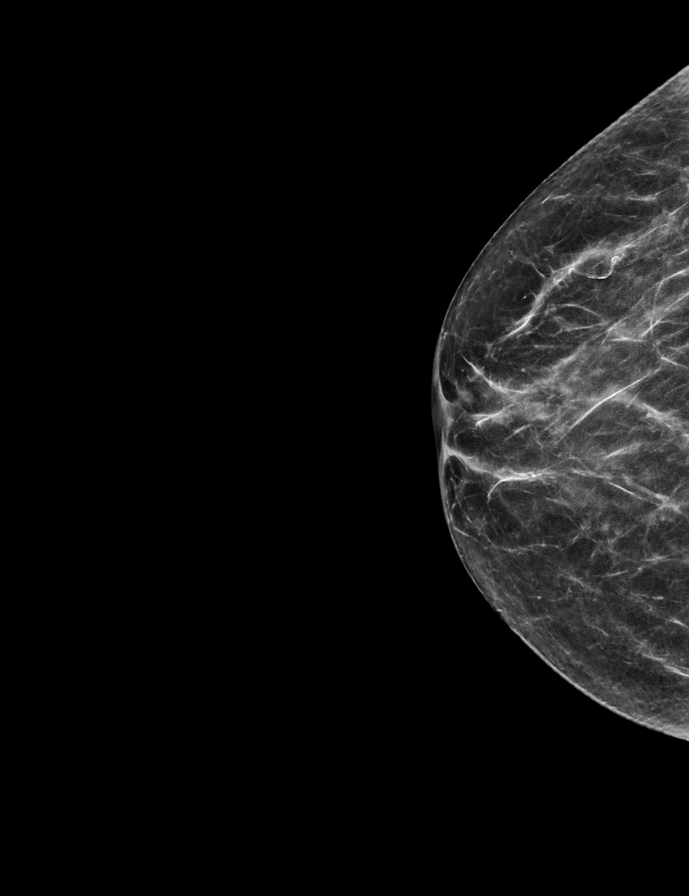

[L CC synth-2D]
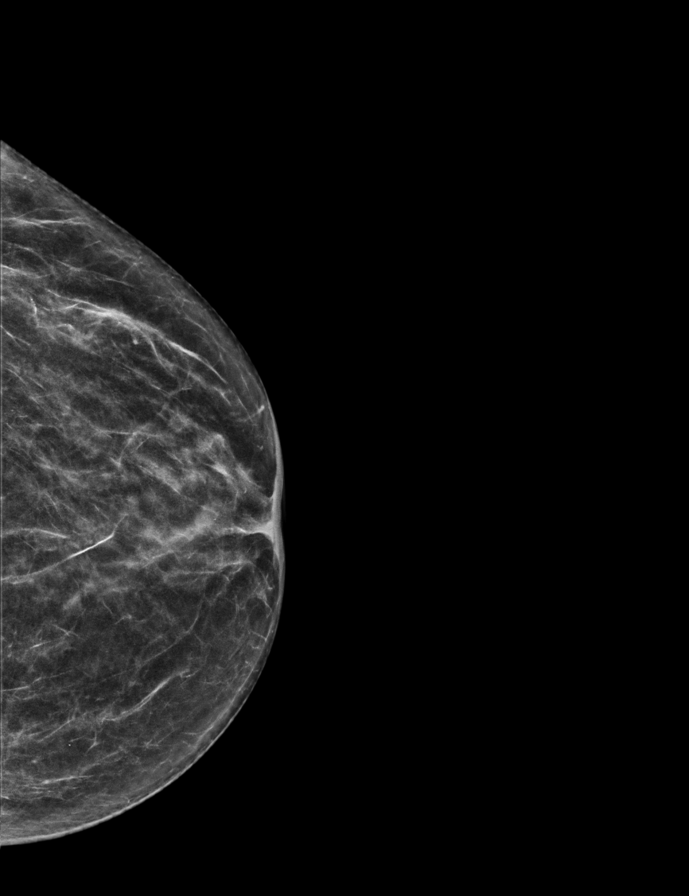

[R MLO synth-2D]
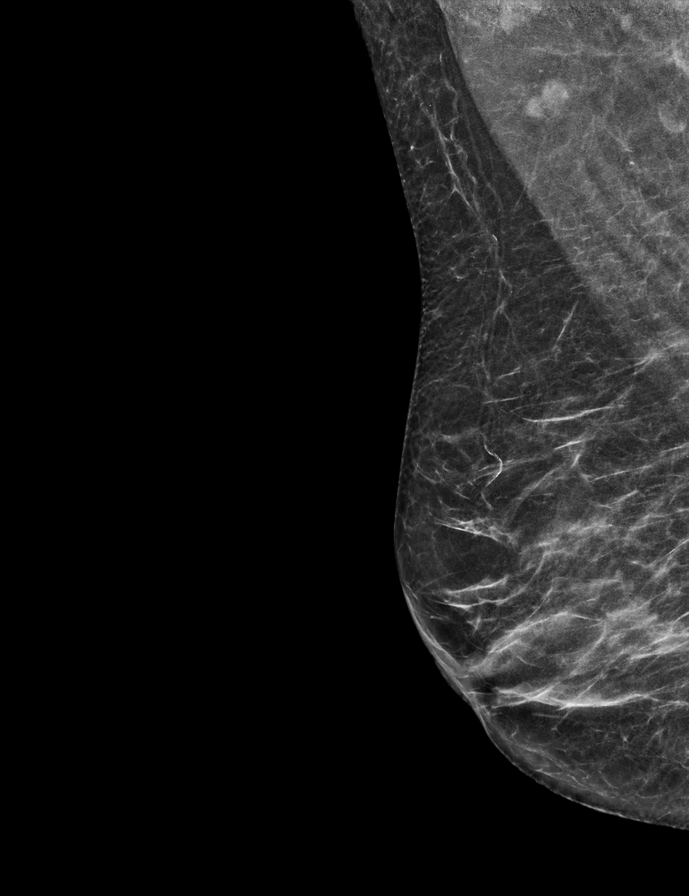

[L MLO synth-2D]
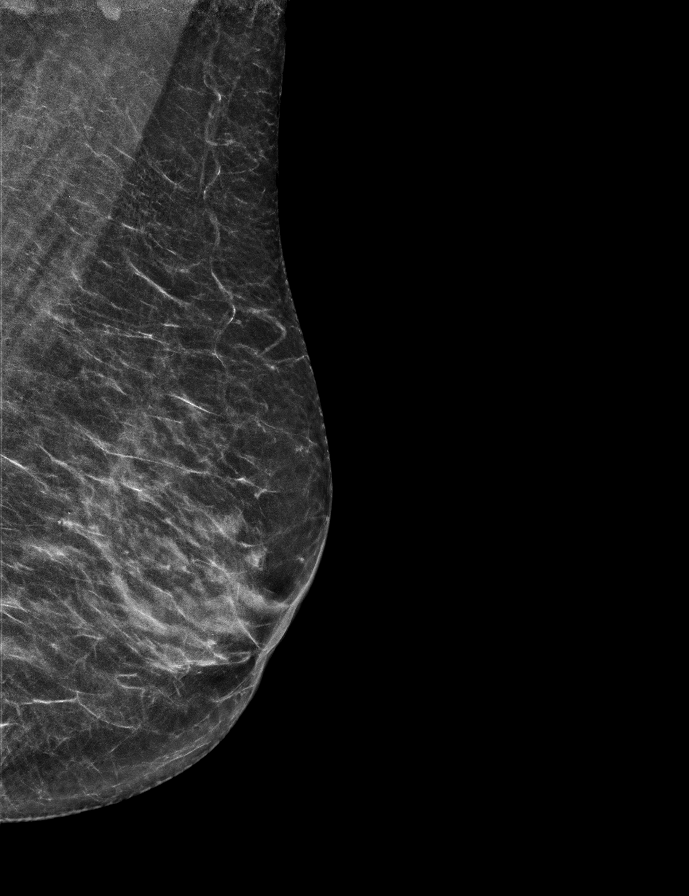

[R CC tomo · 2 of 48 frames shown]
[frame 16/48]
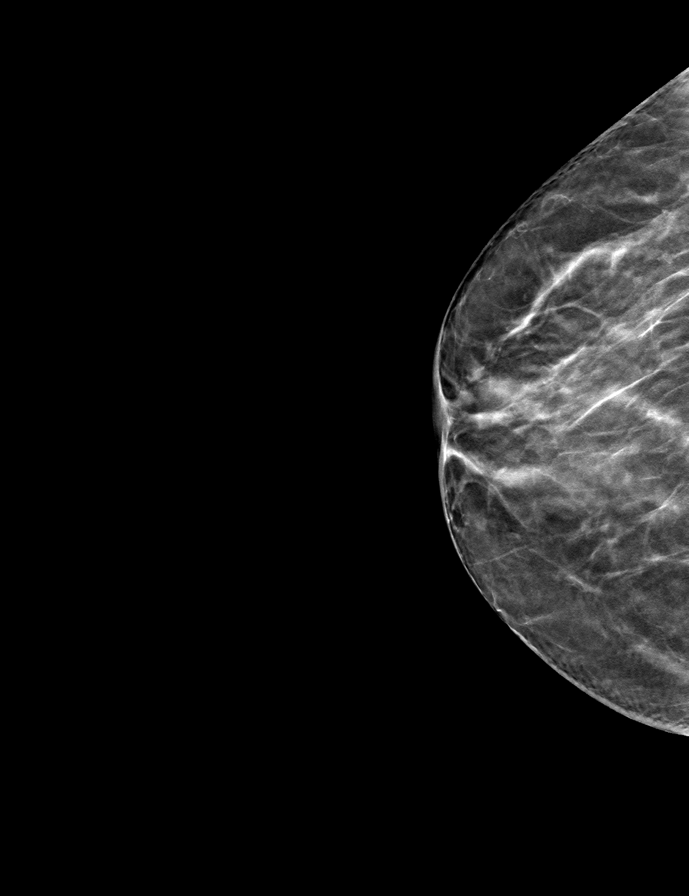
[frame 25/48]
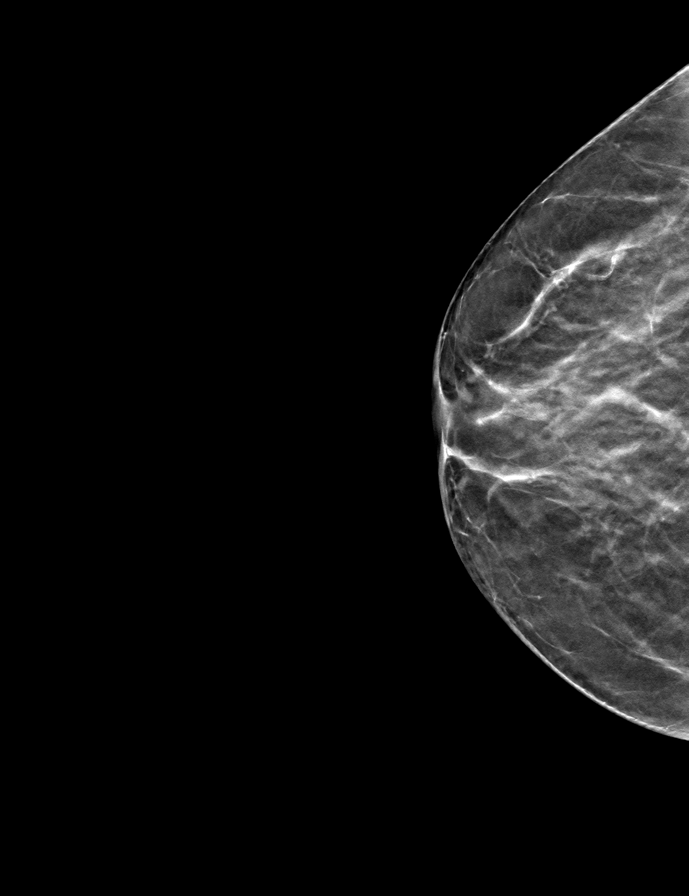

[L MLO tomo · tomo slice 29/57.0]
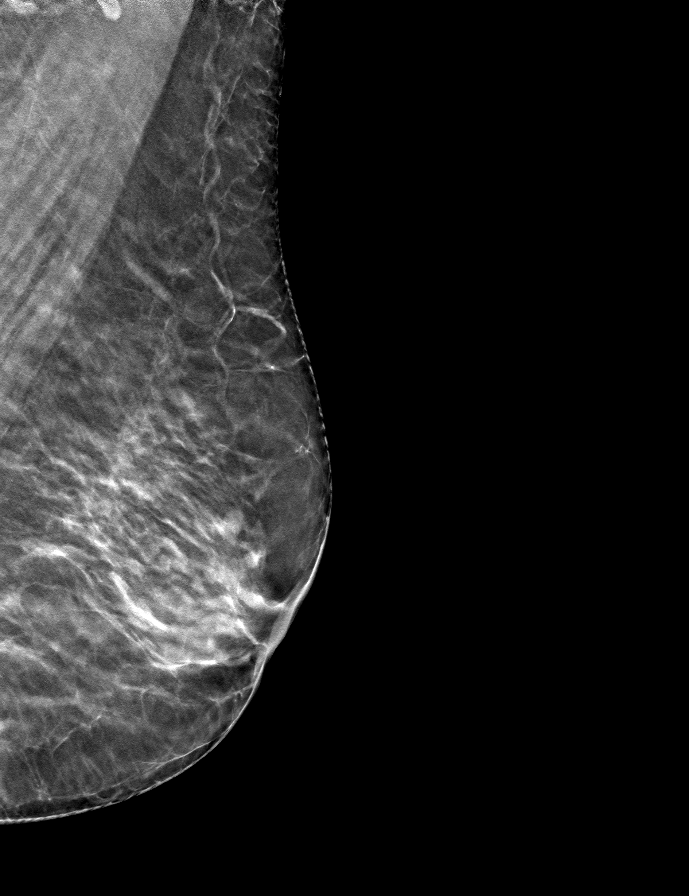

[R MLO tomo · tomo slice 28/55.0]
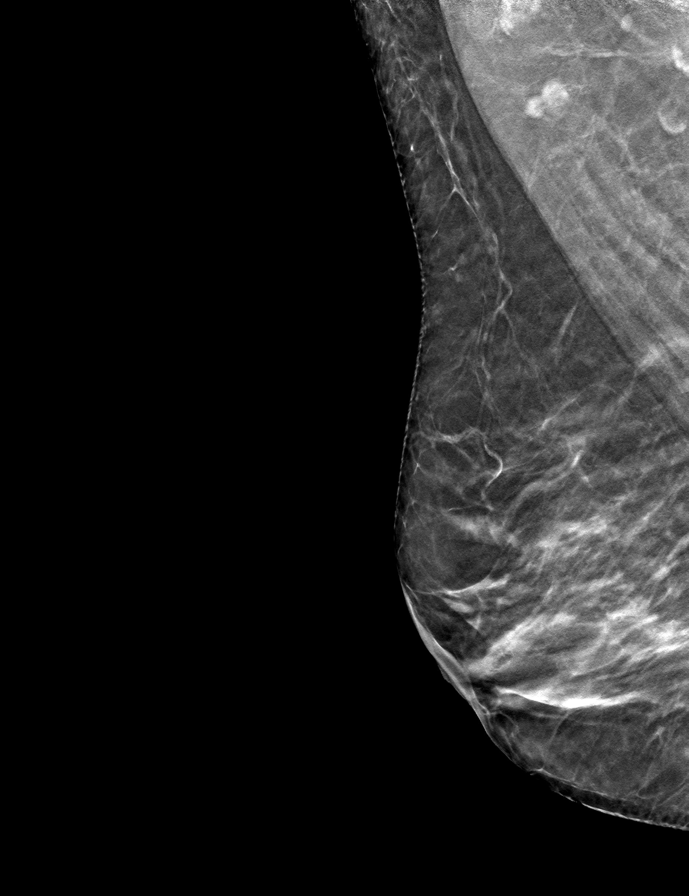

[L CC tomo · tomo slice 27/54.0]
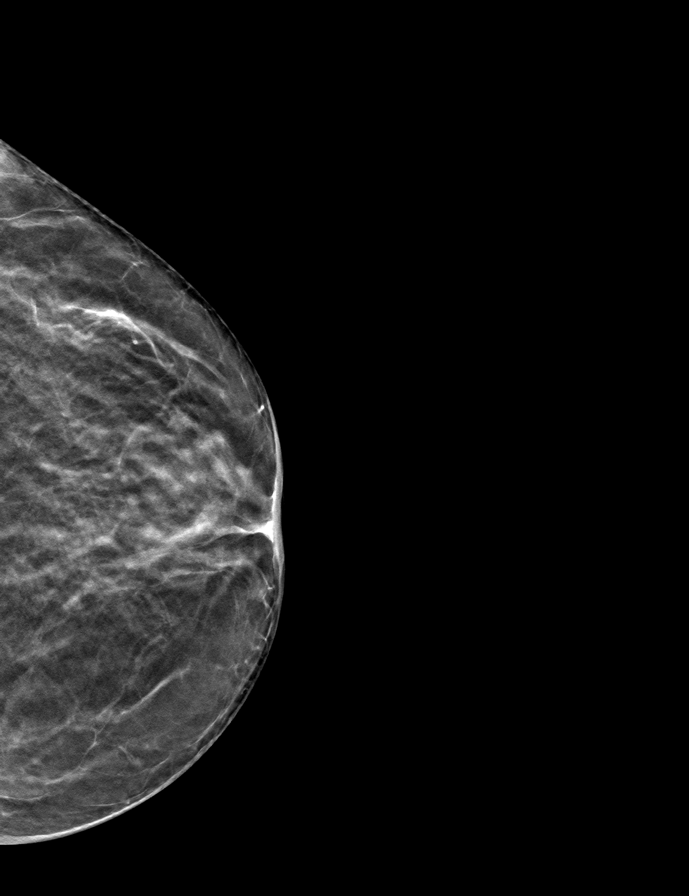

[9 of 24 positions shown; findings below may reference images not displayed]

ACR Breast Density Category c: The breast tissue is heterogeneously
dense, which may obscure small masses.
FINDINGS: There are no findings suspicious for malignancy.
IMPRESSION: No mammographic evidence of malignancy. A result letter of this
screening mammogram will be mailed directly to the patient.

RECOMMENDATION:
Screening mammogram in one year. (Code:Q3-W-BC3)

BI-RADS CATEGORY  1: Negative.

## 2022-12-31 ENCOUNTER — Ambulatory Visit: Payer: Medicare Other | Admitting: Dermatology

## 2022-12-31 VITALS — BP 142/66 | HR 64

## 2022-12-31 DIAGNOSIS — Z85828 Personal history of other malignant neoplasm of skin: Secondary | ICD-10-CM

## 2022-12-31 DIAGNOSIS — L814 Other melanin hyperpigmentation: Secondary | ICD-10-CM

## 2022-12-31 DIAGNOSIS — W908XXA Exposure to other nonionizing radiation, initial encounter: Secondary | ICD-10-CM

## 2022-12-31 DIAGNOSIS — L570 Actinic keratosis: Secondary | ICD-10-CM

## 2022-12-31 DIAGNOSIS — L821 Other seborrheic keratosis: Secondary | ICD-10-CM

## 2022-12-31 DIAGNOSIS — D229 Melanocytic nevi, unspecified: Secondary | ICD-10-CM

## 2022-12-31 DIAGNOSIS — Z85820 Personal history of malignant melanoma of skin: Secondary | ICD-10-CM

## 2022-12-31 DIAGNOSIS — D1801 Hemangioma of skin and subcutaneous tissue: Secondary | ICD-10-CM | POA: Diagnosis not present

## 2022-12-31 DIAGNOSIS — L565 Disseminated superficial actinic porokeratosis (DSAP): Secondary | ICD-10-CM

## 2022-12-31 DIAGNOSIS — L578 Other skin changes due to chronic exposure to nonionizing radiation: Secondary | ICD-10-CM

## 2022-12-31 DIAGNOSIS — Z1283 Encounter for screening for malignant neoplasm of skin: Secondary | ICD-10-CM | POA: Diagnosis not present

## 2022-12-31 DIAGNOSIS — D692 Other nonthrombocytopenic purpura: Secondary | ICD-10-CM

## 2022-12-31 NOTE — Progress Notes (Signed)
Follow-Up Visit   Subjective  Angela Trevino is a 89 y.o. female who presents for the following: Skin Cancer Screening and Full Body Skin Exam  The patient presents for Total-Body Skin Exam (TBSE) for skin cancer screening and mole check. The patient has spots, moles and lesions to be evaluated, some may be new or changing. She has a history of melanoma of the left lateral knee (07/05/2019), history of BCC of the right posterior neck (05/17/2019), and SCC of the left mid forearm (07/03/22).    The following portions of the chart were reviewed this encounter and updated as appropriate: medications, allergies, medical history  Review of Systems:  No other skin or systemic complaints except as noted in HPI or Assessment and Plan.  Objective  Well appearing patient in no apparent distress; mood and affect are within normal limits.  A full examination was performed including scalp, head, eyes, ears, nose, lips, neck, chest, axillae, abdomen, back, buttocks, bilateral upper extremities, bilateral lower extremities, hands, feet, fingers, toes, fingernails, and toenails. All findings within normal limits unless otherwise noted below.   Relevant physical exam findings are noted in the Assessment and Plan.  L elbow x 1, L upper eyebrow x 1, L nasal tip x 1, L nasal dorsum x 1, R temple x 1 (5) Pink scaly macules.     Assessment & Plan   LENTIGINES, SEBORRHEIC KERATOSES, HEMANGIOMAS - Benign normal skin lesions - Benign-appearing - Call for any changes SEBORRHEIC KERATOSIS - 5.0 mm speckled waxy brown macule at the left anterior thigh - Benign-appearing - Discussed benign etiology and prognosis. - Observe - Call for any changes - Photo taken today. Recheck on f/up   MELANOCYTIC NEVI - Tan-brown and/or pink-flesh-colored symmetric macules and papules - Benign appearing on exam today - Observation - Call clinic for new or changing moles - Recommend daily use of broad spectrum spf  30+ sunscreen to sun-exposed areas.   DSAP (disseminated superficial actinic porokeratosis) arms, legs  Exam: Multiple pink brown, scaly macules with keratotic rims    Chronic and persistent condition with duration or expected duration over one year. Not currently at goal.    DSAP is a chronic inherited condition of sun-exposed skin, most commonly affecting the arms and legs.  It is difficult to treat.  Recommend photoprotection and regular use of spf 30 or higher sunscreen to prevent worsening of condition and precancerous changes.   Pt had used cholesterol/lovastatin cream and ammonium lactate 12% cream in the past, but says it didn't help.  Continue daily sunscreen when outdoors   Recommend starting moisturizer with exfoliant (Urea, Salicylic acid, or Lactic acid) one to two times daily to help smooth rough and bumpy skin.  OTC options include Cetaphil Rough and Bumpy lotion (Urea), Eucerin Roughness Relief lotion or spot treatment cream (Urea), CeraVe SA lotion/cream for Rough and Bumpy skin (Sal Acid), Gold Bond Rough and Bumpy cream (Sal Acid), and AmLactin 12% lotion/cream (Lactic Acid).  If applying in morning, also apply sunscreen to sun-exposed areas, since these exfoliating moisturizers can increase sensitivity to sun.    ACTINIC DAMAGE - Chronic condition, secondary to cumulative UV/sun exposure - diffuse scaly erythematous macules with underlying dyspigmentation - Recommend daily broad spectrum sunscreen SPF 30+ to sun-exposed areas, reapply every 2 hours as needed.  - Staying in the shade or wearing long sleeves, sun glasses (UVA+UVB protection) and wide brim hats (4-inch brim around the entire circumference of the hat) are also recommended for sun protection.  -  Call for new or changing lesions.  HISTORY OF MELANOMA Left lateral knee, 07/05/2019 - No evidence of recurrence today - Recommend regular full body skin exams - Recommend daily broad spectrum sunscreen SPF 30+ to  sun-exposed areas, reapply every 2 hours as needed.  - Call if any new or changing lesions are noted between office visits  HISTORY OF BASAL CELL CARCINOMA OF THE SKIN Right posterior neck, 05/17/2019 - No evidence of recurrence today - Recommend regular full body skin exams - Recommend daily broad spectrum sunscreen SPF 30+ to sun-exposed areas, reapply every 2 hours as needed.  - Call if any new or changing lesions are noted between office visits  HISTORY OF SQUAMOUS CELL CARCINOMA OF THE SKIN Left mid forearm, 07/03/2022 - No evidence of recurrence today - No lymphadenopathy - Recommend regular full body skin exams - Recommend daily broad spectrum sunscreen SPF 30+ to sun-exposed areas, reapply every 2 hours as needed.  - Call if any new or changing lesions are noted between office visits  Purpura - Chronic; persistent and recurrent.  Treatable, but not curable. - Violaceous macules and patches - Benign - Related to trauma, age, sun damage and/or use of blood thinners, chronic use of topical and/or oral steroids - Observe - Can use OTC arnica containing moisturizer such as Dermend Bruise Formula if desired - Call for worsening or other concerns   SKIN CANCER SCREENING PERFORMED TODAY.   AK (actinic keratosis) (5) L elbow x 1, L upper eyebrow x 1, L nasal tip x 1, L nasal dorsum x 1, R temple x 1  Actinic keratoses are precancerous spots that appear secondary to cumulative UV radiation exposure/sun exposure over time. They are chronic with expected duration over 1 year. A portion of actinic keratoses will progress to squamous cell carcinoma of the skin. It is not possible to reliably predict which spots will progress to skin cancer and so treatment is recommended to prevent development of skin cancer.  Recommend daily broad spectrum sunscreen SPF 30+ to sun-exposed areas, reapply every 2 hours as needed.  Recommend staying in the shade or wearing long sleeves, sun glasses  (UVA+UVB protection) and wide brim hats (4-inch brim around the entire circumference of the hat). Call for new or changing lesions.  Destruction of lesion - L elbow x 1, L upper eyebrow x 1, L nasal tip x 1, L nasal dorsum x 1, R temple x 1  Destruction method: cryotherapy   Informed consent: discussed and consent obtained   Lesion destroyed using liquid nitrogen: Yes   Region frozen until ice ball extended beyond lesion: Yes   Outcome: patient tolerated procedure well with no complications   Post-procedure details: wound care instructions given   Additional details:  Prior to procedure, discussed risks of blister formation, small wound, skin dyspigmentation, or rare scar following cryotherapy. Recommend Vaseline ointment to treated areas while healing.    Return in about 6 months (around 07/02/2023) for TBSE, Hx melanoma, Hx BCC.  ICherlyn Labella, CMA, am acting as scribe for Willeen Niece, MD .   Documentation: I have reviewed the above documentation for accuracy and completeness, and I agree with the above.  Willeen Niece, MD

## 2022-12-31 NOTE — Patient Instructions (Addendum)
Cryotherapy Aftercare  Wash gently with soap and water everyday.   Apply Vaseline and Band-Aid daily until healed.     Due to recent changes in healthcare laws, you may see results of your pathology and/or laboratory studies on MyChart before the doctors have had a chance to review them. We understand that in some cases there may be results that are confusing or concerning to you. Please understand that not all results are received at the same time and often the doctors may need to interpret multiple results in order to provide you with the best plan of care or course of treatment. Therefore, we ask that you please give us 2 business days to thoroughly review all your results before contacting the office for clarification. Should we see a critical lab result, you will be contacted sooner.   If You Need Anything After Your Visit  If you have any questions or concerns for your doctor, please call our main line at [phone] and press option 4 to reach your doctor's medical assistant. If no one answers, please leave a voicemail as directed and we will return your call as soon as possible. Messages left after 4 pm will be answered the following business day.   You may also send us a message via MyChart. We typically respond to MyChart messages within 1-2 business days.  For prescription refills, please ask your pharmacy to contact our office. Our fax number is [phone].  If you have an urgent issue when the clinic is closed that cannot wait until the next business day, you can page your doctor at the number below.    Please note that while we do our best to be available for urgent issues outside of office hours, we are not available 24/7.   If you have an urgent issue and are unable to reach us, you may choose to seek medical care at your doctor's office, retail clinic, urgent care center, or emergency room.  If you have a medical emergency, please immediately call 911 or go to the  emergency department.  Pager Numbers  - Dr. Kowalski: [phone]  - Dr. Moye: [phone]  - Dr. Stewart: [phone]  In the event of inclement weather, please call our main line at [phone] for an update on the status of any delays or closures.  Dermatology Medication Tips: Please keep the boxes that topical medications come in in order to help keep track of the instructions about where and how to use these. Pharmacies typically print the medication instructions only on the boxes and not directly on the medication tubes.   If your medication is too expensive, please contact our office at [phone] option 4 or send us a message through MyChart.   We are unable to tell what your co-pay for medications will be in advance as this is different depending on your insurance coverage. However, we may be able to find a substitute medication at lower cost or fill out paperwork to get insurance to cover a needed medication.   If a prior authorization is required to get your medication covered by your insurance company, please allow us 1-2 business days to complete this process.  Drug prices often vary depending on where the prescription is filled and some pharmacies may offer cheaper prices.  The website www.goodrx.com contains coupons for medications through different pharmacies. The prices here do not account for what the cost may be with help from insurance (it may be cheaper with your insurance), but the website can   give you the price if you did not use any insurance.  - You can print the associated coupon and take it with your prescription to the pharmacy.  - You may also stop by our office during regular business hours and pick up a GoodRx coupon card.  - If you need your prescription sent electronically to a different pharmacy, notify our office through [facility] MyChart or by phone at [phone] option 4.     Si Usted Necesita Algo Despus de Su Visita  Tambin puede  enviarnos un mensaje a travs de MyChart. Por lo general respondemos a los mensajes de MyChart en el transcurso de 1 a 2 das hbiles.  Para renovar recetas, por favor pida a su farmacia que se ponga en contacto con nuestra oficina. Nuestro nmero de fax es el [phone].  Si tiene un asunto urgente cuando la clnica est cerrada y que no puede esperar hasta el siguiente da hbil, puede llamar/localizar a su doctor(a) al nmero que aparece a continuacin.   Por favor, tenga en cuenta que aunque hacemos todo lo posible para estar disponibles para asuntos urgentes fuera del horario de oficina, no estamos disponibles las 24 horas del da, los 7 das de la semana.   Si tiene un problema urgente y no puede comunicarse con nosotros, puede optar por buscar atencin mdica  en el consultorio de su doctor(a), en una clnica privada, en un centro de atencin urgente o en una sala de emergencias.  Si tiene una emergencia mdica, por favor llame inmediatamente al 911 o vaya a la sala de emergencias.  Nmeros de bper  - Dr. Kowalski: [phone]  - Dra. Moye: [phone]  - Dra. Stewart: [phone]  En caso de inclemencias del tiempo, por favor llame a nuestra lnea principal al [phone] para una actualizacin sobre el estado de cualquier retraso o cierre.  Consejos para la medicacin en dermatologa: Por favor, guarde las cajas en las que vienen los medicamentos de uso tpico para ayudarle a seguir las instrucciones sobre dnde y cmo usarlos. Las farmacias generalmente imprimen las instrucciones del medicamento slo en las cajas y no directamente en los tubos del medicamento.   Si su medicamento es muy caro, por favor, pngase en contacto con nuestra oficina llamando al [phone] y presione la opcin 4 o envenos un mensaje a travs de MyChart.   No podemos decirle cul ser su copago por los medicamentos por adelantado ya que esto es diferente dependiendo de la cobertura de su seguro.  Sin embargo, es posible que podamos encontrar un medicamento sustituto a menor costo o llenar un formulario para que el seguro cubra el medicamento que se considera necesario.   Si se requiere una autorizacin previa para que su compaa de seguros cubra su medicamento, por favor permtanos de 1 a 2 das hbiles para completar este proceso.  Los precios de los medicamentos varan con frecuencia dependiendo del lugar de dnde se surte la receta y alguna farmacias pueden ofrecer precios ms baratos.  El sitio web www.goodrx.com tiene cupones para medicamentos de diferentes farmacias. Los precios aqu no tienen en cuenta lo que podra costar con la ayuda del seguro (puede ser ms barato con su seguro), pero el sitio web puede darle el precio si no utiliz ningn seguro.  - Puede imprimir el cupn correspondiente y llevarlo con su receta a la farmacia.  - Tambin puede pasar por nuestra oficina durante el horario de atencin regular y recoger una tarjeta de cupones de GoodRx.  -   Si necesita que su receta se enve electrnicamente a una farmacia diferente, informe a nuestra oficina a travs de MyChart de [facility] o por telfono llamando al [phone] y presione la opcin 4.  

## 2023-02-10 ENCOUNTER — Other Ambulatory Visit: Payer: Self-pay | Admitting: Family Medicine

## 2023-02-10 DIAGNOSIS — Z1231 Encounter for screening mammogram for malignant neoplasm of breast: Secondary | ICD-10-CM

## 2023-03-17 ENCOUNTER — Ambulatory Visit
Admission: RE | Admit: 2023-03-17 | Discharge: 2023-03-17 | Disposition: A | Discharge status: Home / Self Care | Payer: Medicare Other | Source: Ambulatory Visit | Attending: Family Medicine | Admitting: Family Medicine

## 2023-03-17 DIAGNOSIS — Z1231 Encounter for screening mammogram for malignant neoplasm of breast: Secondary | ICD-10-CM | POA: Diagnosis present

## 2023-06-24 ENCOUNTER — Ambulatory Visit: Payer: Medicare Other | Admitting: Dermatology

## 2023-06-24 ENCOUNTER — Encounter: Payer: Self-pay | Admitting: Dermatology

## 2023-06-24 DIAGNOSIS — Z1283 Encounter for screening for malignant neoplasm of skin: Secondary | ICD-10-CM

## 2023-06-24 DIAGNOSIS — L820 Inflamed seborrheic keratosis: Secondary | ICD-10-CM | POA: Diagnosis not present

## 2023-06-24 DIAGNOSIS — D692 Other nonthrombocytopenic purpura: Secondary | ICD-10-CM

## 2023-06-24 DIAGNOSIS — W908XXA Exposure to other nonionizing radiation, initial encounter: Secondary | ICD-10-CM

## 2023-06-24 DIAGNOSIS — Z85820 Personal history of malignant melanoma of skin: Secondary | ICD-10-CM

## 2023-06-24 DIAGNOSIS — L578 Other skin changes due to chronic exposure to nonionizing radiation: Secondary | ICD-10-CM | POA: Diagnosis not present

## 2023-06-24 DIAGNOSIS — L814 Other melanin hyperpigmentation: Secondary | ICD-10-CM

## 2023-06-24 DIAGNOSIS — L565 Disseminated superficial actinic porokeratosis (DSAP): Secondary | ICD-10-CM

## 2023-06-24 DIAGNOSIS — L821 Other seborrheic keratosis: Secondary | ICD-10-CM

## 2023-06-24 DIAGNOSIS — Z85828 Personal history of other malignant neoplasm of skin: Secondary | ICD-10-CM

## 2023-06-24 DIAGNOSIS — D1801 Hemangioma of skin and subcutaneous tissue: Secondary | ICD-10-CM

## 2023-06-24 DIAGNOSIS — D229 Melanocytic nevi, unspecified: Secondary | ICD-10-CM

## 2023-06-24 NOTE — Patient Instructions (Signed)

## 2023-06-24 NOTE — Progress Notes (Signed)
Follow-Up Visit   Subjective  Angela Trevino is a 89 y.o. female who presents for the following: Skin Cancer Screening and Full Body Skin Exam  The patient presents for Total-Body Skin Exam (TBSE) for skin cancer screening and mole check. The patient has spots, moles and lesions to be evaluated, some may be new or changing and the patient may have concern these could be cancer.  Hx MM, BCC, SCC  The following portions of the chart were reviewed this encounter and updated as appropriate: medications, allergies, medical history  Review of Systems:  No other skin or systemic complaints except as noted in HPI or Assessment and Plan.  Objective  Well appearing patient in no apparent distress; mood and affect are within normal limits.  A full examination was performed including scalp, head, eyes, ears, nose, lips, neck, chest, axillae, abdomen, back, buttocks, bilateral upper extremities, bilateral lower extremities, hands, feet, fingers, toes, fingernails, and toenails. All findings within normal limits unless otherwise noted below.   Relevant physical exam findings are noted in the Assessment and Plan.  L lateral upper calf x 2, L lateral knee x 1 (3) Erythematous stuck-on, waxy papule  Assessment & Plan   SKIN CANCER SCREENING PERFORMED TODAY.  ACTINIC DAMAGE - Chronic condition, secondary to cumulative UV/sun exposure - diffuse scaly erythematous macules with underlying dyspigmentation - Recommend daily broad spectrum sunscreen SPF 30+ to sun-exposed areas, reapply every 2 hours as needed.  - Staying in the shade or wearing long sleeves, sun glasses (UVA+UVB protection) and wide brim hats (4-inch brim around the entire circumference of the hat) are also recommended for sun protection.  - Call for new or changing lesions.  LENTIGINES, SEBORRHEIC KERATOSES, HEMANGIOMAS - Benign normal skin lesions - Benign-appearing - Call for any changes - 5.0 mm speckled waxy brown macule at  the left anterior thigh, favor SK  MELANOCYTIC NEVI - Tan-brown and/or pink-flesh-colored symmetric macules and papules - Benign appearing on exam today - Observation - Call clinic for new or changing moles - Recommend daily use of broad spectrum spf 30+ sunscreen to sun-exposed areas.   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  - Call if any new or changing lesions are noted between office visits   HISTORY OF BASAL CELL CARCINOMA OF THE SKIN Right posterior neck, 05/17/2019 - No evidence of recurrence today - Recommend regular full body skin exams - Recommend daily broad spectrum sunscreen SPF 30+ to sun-exposed areas, reapply every 2 hours as needed.  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  - Call if any new or changing lesions are noted between office visits  DSAP (disseminated superficial actinic porokeratosis) arms, legs   Exam: Multiple pink brown, scaly macules with keratotic rims    Chronic and persistent condition with duration or expected duration over one year. Not currently at goal.    DSAP is a chronic inherited condition of sun-exposed skin, most commonly affecting the arms and legs.  It is difficult to treat.  Recommend  photoprotection and regular use of spf 30 or higher sunscreen to prevent worsening of condition and precancerous changes.   Pt had used cholesterol/lovastatin cream and ammonium lactate 12% cream in the past, but says it didn't help.  Continue daily sunscreen  when outdoors   Recommend starting moisturizer with exfoliant (Urea, Salicylic acid, or Lactic acid) one to two times daily to help smooth rough and bumpy skin.  OTC options include Cetaphil Rough and Bumpy lotion (Urea), Eucerin Roughness Relief lotion or spot treatment cream (Urea), CeraVe SA lotion/cream for Rough and Bumpy skin (Sal Acid), Gold Bond Rough and Bumpy cream (Sal Acid), and AmLactin 12% lotion/cream (Lactic Acid).  If applying in morning, also apply sunscreen to sun-exposed areas, since these exfoliating moisturizers can increase sensitivity to sun  Purpura - Chronic; persistent and recurrent.  Treatable, but not curable. - Violaceous macules and patches - Benign - Related to trauma, age, sun damage and/or use of blood thinners, chronic use of topical and/or oral steroids - Observe - Can use OTC arnica containing moisturizer such as Dermend Bruise Formula if desired - Call for worsening or other concerns   INFLAMED SEBORRHEIC KERATOSIS (3) L lateral upper calf x 2, L lateral knee x 1 (3) Vs hypertrophic AK's  Symptomatic, irritating, patient would like treated.   Destruction of lesion - L lateral upper calf x 2, L lateral knee x 1 (3)  Destruction method: cryotherapy   Informed consent: discussed and consent obtained   Lesion destroyed using liquid nitrogen: Yes   Region frozen until ice ball extended beyond lesion: Yes   Outcome: patient tolerated procedure well with no complications   Post-procedure details: wound care instructions given   Additional details:  Prior to procedure, discussed risks of blister formation, small wound, skin dyspigmentation, or rare scar following cryotherapy. Recommend Vaseline ointment to treated areas while healing.  Return in about 6 months (around 12/23/2023) for TBSE, with Dr. Roseanne Reno, Hx SCC, Hx MM, Hx BCC.  Anise Salvo, RMA, am acting as scribe for Willeen Niece, MD .   Documentation: I have reviewed the above documentation  for accuracy and completeness, and I agree with the above.  Willeen Niece, MD

## 2023-10-29 ENCOUNTER — Ambulatory Visit: Admitting: Dermatology

## 2023-10-29 ENCOUNTER — Encounter: Payer: Self-pay | Admitting: Dermatology

## 2023-10-29 DIAGNOSIS — L578 Other skin changes due to chronic exposure to nonionizing radiation: Secondary | ICD-10-CM

## 2023-10-29 DIAGNOSIS — S80812A Abrasion, left lower leg, initial encounter: Secondary | ICD-10-CM | POA: Diagnosis not present

## 2023-10-29 DIAGNOSIS — T148XXA Other injury of unspecified body region, initial encounter: Secondary | ICD-10-CM

## 2023-10-29 DIAGNOSIS — D692 Other nonthrombocytopenic purpura: Secondary | ICD-10-CM | POA: Diagnosis not present

## 2023-10-29 DIAGNOSIS — W908XXA Exposure to other nonionizing radiation, initial encounter: Secondary | ICD-10-CM | POA: Diagnosis not present

## 2023-10-29 NOTE — Patient Instructions (Addendum)
 Recommend vaseline or prescription antibiotic ointment (Mupirocin) you have at home. Keep covered with bandage.     Can use OTC arnica containing moisturizer such as Dermend Bruise Formula if desired.    Due to recent changes in healthcare laws, you may see results of your pathology and/or laboratory studies on MyChart before the doctors have had a chance to review them. We understand that in some cases there may be results that are confusing or concerning to you. Please understand that not all results are received at the same time and often the doctors may need to interpret multiple results in order to provide you with the best plan of care or course of treatment. Therefore, we ask that you please give us  2 business days to thoroughly review all your results before contacting the office for clarification. Should we see a critical lab result, you will be contacted sooner.   If You Need Anything After Your Visit  If you have any questions or concerns for your doctor, please call our main line at [phone] and press option 4 to reach your doctor's medical assistant. If no one answers, please leave a voicemail as directed and we will return your call as soon as possible. Messages left after 4 pm will be answered the following business day.   You may also send us  a message via MyChart. We typically respond to MyChart messages within 1-2 business days.  For prescription refills, please ask your pharmacy to contact our office. Our fax number is [phone].  If you have an urgent issue when the clinic is closed that cannot wait until the next business day, you can page your doctor at the number below.    Please note that while we do our best to be available for urgent issues outside of office hours, we are not available 24/7.   If you have an urgent issue and are unable to reach us , you may choose to seek medical care at your doctor's office, retail clinic, urgent care center, or emergency  room.  If you have a medical emergency, please immediately call 911 or go to the emergency department.  Pager Numbers  - Dr. Bary Likes: [phone]  - Dr. Annette Barters: [phone]  - Dr. Felipe Horton: [phone]   In the event of inclement weather, please call our main line at [phone] for an update on the status of any delays or closures.  Dermatology Medication Tips: Please keep the boxes that topical medications come in in order to help keep track of the instructions about where and how to use these. Pharmacies typically print the medication instructions only on the boxes and not directly on the medication tubes.   If your medication is too expensive, please contact our office at [phone] option 4 or send us  a message through MyChart.   We are unable to tell what your co-pay for medications will be in advance as this is different depending on your insurance coverage. However, we may be able to find a substitute medication at lower cost or fill out paperwork to get insurance to cover a needed medication.   If a prior authorization is required to get your medication covered by your insurance company, please allow us  1-2 business days to complete this process.  Drug prices often vary depending on where the prescription is filled and some pharmacies may offer cheaper prices.  The website www.goodrx.com contains coupons for medications through different pharmacies. The prices here do not account for what the cost may be with help  from insurance (it may be cheaper with your insurance), but the website can give you the price if you did not use any insurance.  - You can print the associated coupon and take it with your prescription to the pharmacy.  - You may also stop by our office during regular business hours and pick up a GoodRx coupon card.  - If you need your prescription sent electronically to a different pharmacy, notify our office through [facility] or by phone at  [phone] option 4.     Si Usted Necesita Algo Despus de Su Visita  Tambin puede enviarnos un mensaje a travs de Clinical cytogeneticist. Por lo general respondemos a los mensajes de MyChart en el transcurso de 1 a 2 das hbiles.  Para renovar recetas, por favor pida a su farmacia que se ponga en contacto con nuestra oficina. Franz Jacks de fax es [place] [phone].  Si tiene un asunto urgente cuando la clnica est cerrada y que no puede esperar hasta el siguiente da hbil, puede llamar/localizar a su doctor(a) al nmero que aparece a continuacin.   Por favor, tenga en cuenta que aunque hacemos todo lo posible para estar disponibles para asuntos urgentes fuera del horario de [place], no estamos disponibles las 24 horas del da, los 7 [address]  [address] de la [place].   Si tiene un problema urgente y no puede comunicarse con nosotros, puede optar por buscar atencin mdica  en el consultorio de su doctor(a), en una clnica privada, en un centro de atencin urgente o en una sala de emergencias.  Si tiene Engineer, drilling, por favor llame inmediatamente al 911 o vaya a la sala de emergencias.  Nmeros de bper  - Dr. Bary Likes: [phone]  - Dra. Annette Barters: [phone]  - Dr. Felipe Horton: [phone]   En caso de inclemencias del tiempo, por favor llame a Lajuan Pila principal al [phone] para una actualizacin sobre el [place] de cualquier retraso o cierre.  Consejos para la medicacin en dermatologa: Por favor, guarde las cajas en las que vienen los medicamentos de uso tpico para ayudarle a seguir las instrucciones sobre dnde y cmo usarlos. Las farmacias generalmente imprimen las instrucciones del medicamento slo en las cajas y no directamente en los tubos del [place].   Si su medicamento es muy caro, por favor, pngase en contacto con Bettyjane Brunet llamando al [phone] y presione la opcin 4 o envenos un mensaje a travs de Clinical cytogeneticist.   No podemos decirle cul ser su copago por los  medicamentos por adelantado ya que esto es diferente dependiendo de la cobertura de su seguro. Sin embargo, es posible que podamos encontrar un medicamento sustituto a Audiological scientist un formulario para que el seguro cubra el medicamento que se considera necesario.   Si se requiere una autorizacin previa para que su compaa de seguros [place] su medicamento, por favor permtanos de 1 a 2 das hbiles para completar este proceso.  Los precios de los medicamentos varan con frecuencia dependiendo del Environmental consultant de dnde se surte la receta y alguna farmacias pueden ofrecer precios ms baratos.  El sitio web www.goodrx.com tiene cupones para medicamentos de Health and safety inspector. Los precios aqu no tienen en cuenta lo que podra costar con la ayuda del seguro (puede ser ms barato con su seguro), pero el sitio web puede darle el precio si no utiliz Tourist information centre manager.  - Puede imprimir el cupn correspondiente y llevarlo con su receta a la farmacia.  - Tambin puede pasar por nuestra oficina durante el  horario de Visual merchandiser regular y Education officer, museum una tarjeta de cupones de GoodRx.  - Si necesita que su receta se enve electrnicamente a una farmacia diferente, informe a nuestra oficina a travs de MyChart de [place] o por telfono llamando al [phone] y presione la opcin 4.

## 2023-10-29 NOTE — Progress Notes (Signed)
   Follow-Up Visit   Subjective  Angela Trevino is a 89 y.o. female who presents for the following: Spots on legs that are bleeding. Noticed blood when she took her sock off last night before bed. Does not recall injury to legs. States she has not fallen.   The patient has spots, moles and lesions to be evaluated, some may be new or changing and the patient may have concern these could be cancer.    The following portions of the chart were reviewed this encounter and updated as appropriate: medications, allergies, medical history  Review of Systems:  No other skin or systemic complaints except as noted in HPI or Assessment and Plan.  Objective  Well appearing patient in no apparent distress; mood and affect are within normal limits.  A focused examination was performed of the following areas: B/L lower legs  Relevant physical exam findings are noted in the Assessment and Plan.    Assessment & Plan   Purpura - Chronic; persistent and recurrent.  Treatable, but not curable. - Violaceous macules and patches at legs and arms.  - Benign - Related to trauma, age, sun damage and/or use of blood thinners, chronic use of topical and/or oral steroids - Observe - Can use OTC arnica containing moisturizer such as Dermend Bruise Formula if desired - Call for worsening or other concerns  ACTINIC DAMAGE. Arms, legs.  - chronic, secondary to cumulative UV radiation exposure/sun exposure over time - diffuse scaly erythematous macules with underlying dyspigmentation - Recommend daily broad spectrum sunscreen SPF 30+ to sun-exposed areas, reapply every 2 hours as needed.  - Recommend staying in the shade or wearing long sleeves, sun glasses (UVA+UVB protection) and wide brim hats (4-inch brim around the entire circumference of the hat). - Call for new or changing lesions.  EXCORIATION Exam:  purpuric patch with overlying heme crusting at left lower pretibia  Treatment Plan: Recommend  vaseline or prescription mupirocin antibiotic ointment (has at home) and bandaid. Change daily until healed. Call if not resolving.      Return if symptoms worsen or fail to improve.  I, Jill Parcell, CMA, am acting as scribe for Artemio Larry, MD.   Documentation: I have reviewed the above documentation for accuracy and completeness, and I agree with the above.  Artemio Larry, MD

## 2023-12-23 ENCOUNTER — Encounter: Payer: Medicare Other | Admitting: Dermatology

## 2023-12-24 ENCOUNTER — Ambulatory Visit: Admitting: Dermatology

## 2023-12-24 DIAGNOSIS — W908XXA Exposure to other nonionizing radiation, initial encounter: Secondary | ICD-10-CM

## 2023-12-24 DIAGNOSIS — L821 Other seborrheic keratosis: Secondary | ICD-10-CM

## 2023-12-24 DIAGNOSIS — Z85828 Personal history of other malignant neoplasm of skin: Secondary | ICD-10-CM

## 2023-12-24 DIAGNOSIS — D692 Other nonthrombocytopenic purpura: Secondary | ICD-10-CM

## 2023-12-24 DIAGNOSIS — D2261 Melanocytic nevi of right upper limb, including shoulder: Secondary | ICD-10-CM

## 2023-12-24 DIAGNOSIS — Z85820 Personal history of malignant melanoma of skin: Secondary | ICD-10-CM

## 2023-12-24 DIAGNOSIS — L814 Other melanin hyperpigmentation: Secondary | ICD-10-CM | POA: Diagnosis not present

## 2023-12-24 DIAGNOSIS — L578 Other skin changes due to chronic exposure to nonionizing radiation: Secondary | ICD-10-CM

## 2023-12-24 DIAGNOSIS — Z1283 Encounter for screening for malignant neoplasm of skin: Secondary | ICD-10-CM | POA: Diagnosis not present

## 2023-12-24 DIAGNOSIS — L729 Follicular cyst of the skin and subcutaneous tissue, unspecified: Secondary | ICD-10-CM

## 2023-12-24 DIAGNOSIS — D1801 Hemangioma of skin and subcutaneous tissue: Secondary | ICD-10-CM

## 2023-12-24 DIAGNOSIS — L720 Epidermal cyst: Secondary | ICD-10-CM

## 2023-12-24 DIAGNOSIS — D229 Melanocytic nevi, unspecified: Secondary | ICD-10-CM

## 2023-12-24 DIAGNOSIS — L565 Disseminated superficial actinic porokeratosis (DSAP): Secondary | ICD-10-CM

## 2023-12-24 NOTE — Progress Notes (Signed)
 Follow-Up Visit   Subjective  Angela Trevino is a 89 y.o. female who presents for the following: Skin Cancer Screening and Full Body Skin Exam  The patient presents for Total-Body Skin Exam (TBSE) for skin cancer screening and mole check. The patient has spots, moles and lesions to be evaluated, some may be new or changing. History of melanoma, SCC, BCC.    The following portions of the chart were reviewed this encounter and updated as appropriate: medications, allergies, medical history  Review of Systems:  No other skin or systemic complaints except as noted in HPI or Assessment and Plan.  Objective  Well appearing patient in no apparent distress; mood and affect are within normal limits.  A full examination was performed including scalp, head, eyes, ears, nose, lips, neck, chest, axillae, abdomen, back, buttocks, bilateral upper extremities, bilateral lower extremities, hands, feet, fingers, toes, fingernails, and toenails. All findings within normal limits unless otherwise noted below.   Relevant physical exam findings are noted in the Assessment and Plan.    Assessment & Plan   SKIN CANCER SCREENING PERFORMED TODAY.  ACTINIC DAMAGE - Chronic condition, secondary to cumulative UV/sun exposure - diffuse scaly erythematous macules with underlying dyspigmentation - Recommend daily broad spectrum sunscreen SPF 30+ to sun-exposed areas, reapply every 2 hours as needed.  - Staying in the shade or wearing long sleeves, sun glasses (UVA+UVB protection) and wide brim hats (4-inch brim around the entire circumference of the hat) are also recommended for sun protection.  - Call for new or changing lesions.  LENTIGINES, SEBORRHEIC KERATOSES, HEMANGIOMAS - Benign normal skin lesions - 5.0 mm speckled waxy brown macule at the left anterior thigh, favor SK- stable - Benign-appearing - Call for any changes   MELANOCYTIC NEVI - Tan-brown and/or pink-flesh-colored symmetric macules  and papules - right upper arm 7 mm flesh papule, present for years per pt - Benign appearing on exam today - Observation - Call clinic for new or changing moles - Recommend daily use of broad spectrum spf 30+ sunscreen to sun-exposed areas.   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  - Call if any new or changing lesions are noted between office visits   HISTORY OF BASAL CELL CARCINOMA OF THE SKIN Right posterior neck, 05/17/2019 - No evidence of recurrence today - Recommend regular full body skin exams - Recommend daily broad spectrum sunscreen SPF 30+ to sun-exposed areas, reapply every 2 hours as needed.  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  - Call if any new or changing lesions are noted between office visits   Purpura - Chronic; persistent and recurrent.  Treatable, but not curable. - Violaceous macules and patches - Benign - Related to trauma, age, sun damage and/or use of blood thinners, chronic use of topical and/or oral steroids - Observe - Can use OTC arnica containing moisturizer such as Dermend Bruise Formula if desired - Call for worsening or other concerns  EPIDERMAL INCLUSION CYST Exam: Subcutaneous nodule at right posterior shoulder,  1.5 cm  Benign-appearing. Exam most consistent with an epidermal inclusion cyst. Discussed that a cyst is a benign growth that can grow over time and sometimes get irritated or inflamed. Recommend observation if it is not bothersome. Discussed  option of surgical excision to remove it if it is growing, symptomatic, or other changes noted. Please call for new or changing lesions so they can be evaluated.   DSAP (disseminated superficial actinic porokeratosis) arms, legs  Exam: Multiple pink brown, scaly macules with keratotic rims    Chronic and persistent condition with duration or expected duration over one year. Not currently at goal.    DSAP is a chronic inherited condition of sun-exposed skin, most commonly affecting the arms and legs.  It is difficult to treat.  Recommend photoprotection and regular use of spf 30 or higher sunscreen to prevent worsening of condition and precancerous changes.   Pt had used cholesterol/lovastatin cream and ammonium lactate  12% cream in the past, but says it didn't help.  Continue daily sunscreen when outdoors     Return in about 6 months (around 06/24/2024) for TBSE, Hx melanoma, Hx SCC, Hx BCC.  IBernardine Bridegroom, CMA, am acting as scribe for Artemio Larry, MD .   Documentation: I have reviewed the above documentation for accuracy and completeness, and I agree with the above.  Artemio Larry, MD

## 2023-12-24 NOTE — Patient Instructions (Signed)

## 2024-04-01 ENCOUNTER — Other Ambulatory Visit: Payer: Self-pay | Admitting: Family Medicine

## 2024-04-01 DIAGNOSIS — Z1231 Encounter for screening mammogram for malignant neoplasm of breast: Secondary | ICD-10-CM

## 2024-04-29 ENCOUNTER — Ambulatory Visit
Admission: RE | Admit: 2024-04-29 | Discharge: 2024-04-29 | Disposition: A | Discharge status: Home / Self Care | Source: Ambulatory Visit | Attending: Family Medicine | Admitting: Family Medicine

## 2024-04-29 DIAGNOSIS — Z1231 Encounter for screening mammogram for malignant neoplasm of breast: Secondary | ICD-10-CM | POA: Insufficient documentation

## 2024-07-12 ENCOUNTER — Encounter: Admitting: Dermatology

## 2024-08-18 ENCOUNTER — Ambulatory Visit: Admitting: Dermatology
